# Patient Record
Sex: Female | Born: 1980 | Race: Black or African American | Hispanic: No | Marital: Single | Smoking: Never smoker
Health system: Southern US, Community
[De-identification: ages and names within clinical notes are randomized; demographics above are authoritative.]

## PROBLEM LIST (undated history)

## (undated) ENCOUNTER — Inpatient Hospital Stay (HOSPITAL_COMMUNITY): Payer: Self-pay

## (undated) DIAGNOSIS — G43909 Migraine, unspecified, not intractable, without status migrainosus: Secondary | ICD-10-CM

## (undated) DIAGNOSIS — R87629 Unspecified abnormal cytological findings in specimens from vagina: Secondary | ICD-10-CM

## (undated) DIAGNOSIS — J309 Allergic rhinitis, unspecified: Secondary | ICD-10-CM

## (undated) DIAGNOSIS — K589 Irritable bowel syndrome without diarrhea: Secondary | ICD-10-CM

## (undated) DIAGNOSIS — J45909 Unspecified asthma, uncomplicated: Secondary | ICD-10-CM

## (undated) DIAGNOSIS — E282 Polycystic ovarian syndrome: Secondary | ICD-10-CM

## (undated) DIAGNOSIS — L272 Dermatitis due to ingested food: Secondary | ICD-10-CM

## (undated) HISTORY — DX: Unspecified asthma, uncomplicated: J45.909

## (undated) HISTORY — DX: Polycystic ovarian syndrome: E28.2

## (undated) HISTORY — DX: Migraine, unspecified, not intractable, without status migrainosus: G43.909

## (undated) HISTORY — PX: GYNECOLOGIC CRYOSURGERY: SHX857

## (undated) HISTORY — DX: Irritable bowel syndrome, unspecified: K58.9

## (undated) HISTORY — DX: Dermatitis due to ingested food: L27.2

## (undated) HISTORY — DX: Unspecified abnormal cytological findings in specimens from vagina: R87.629

## (undated) HISTORY — DX: Allergic rhinitis, unspecified: J30.9

---

## 2005-09-29 ENCOUNTER — Encounter: Payer: Self-pay | Admitting: Internal Medicine

## 2006-03-20 ENCOUNTER — Emergency Department (HOSPITAL_COMMUNITY): Admission: EM | Admit: 2006-03-20 | Discharge: 2006-03-20 | Payer: Self-pay | Admitting: Emergency Medicine

## 2010-05-30 ENCOUNTER — Emergency Department (HOSPITAL_COMMUNITY): Admission: EM | Admit: 2010-05-30 | Discharge: 2010-05-30 | Payer: Self-pay | Admitting: Emergency Medicine

## 2010-11-24 ENCOUNTER — Other Ambulatory Visit: Payer: Self-pay | Admitting: Internal Medicine

## 2010-11-24 ENCOUNTER — Ambulatory Visit
Admission: RE | Admit: 2010-11-24 | Discharge: 2010-11-24 | Payer: Self-pay | Source: Home / Self Care | Attending: Internal Medicine | Admitting: Internal Medicine

## 2010-11-24 ENCOUNTER — Encounter: Payer: Self-pay | Admitting: Internal Medicine

## 2010-11-24 DIAGNOSIS — G43909 Migraine, unspecified, not intractable, without status migrainosus: Secondary | ICD-10-CM | POA: Insufficient documentation

## 2010-11-24 DIAGNOSIS — K589 Irritable bowel syndrome without diarrhea: Secondary | ICD-10-CM | POA: Insufficient documentation

## 2010-11-24 DIAGNOSIS — L272 Dermatitis due to ingested food: Secondary | ICD-10-CM | POA: Insufficient documentation

## 2010-11-24 DIAGNOSIS — J45909 Unspecified asthma, uncomplicated: Secondary | ICD-10-CM | POA: Insufficient documentation

## 2010-11-24 DIAGNOSIS — J309 Allergic rhinitis, unspecified: Secondary | ICD-10-CM | POA: Insufficient documentation

## 2010-11-24 LAB — URINALYSIS, ROUTINE W REFLEX MICROSCOPIC
Bilirubin Urine: NEGATIVE
Ketones, ur: NEGATIVE
Leukocytes, UA: NEGATIVE
Nitrite: NEGATIVE
Specific Gravity, Urine: 1.025 (ref 1.000–1.030)
Total Protein, Urine: NEGATIVE
Urine Glucose: NEGATIVE
Urobilinogen, UA: 0.2 (ref 0.0–1.0)
pH: 6 (ref 5.0–8.0)

## 2010-11-24 LAB — CBC WITH DIFFERENTIAL/PLATELET
Basophils Absolute: 0.1 10*3/uL (ref 0.0–0.1)
Basophils Relative: 1.3 % (ref 0.0–3.0)
Eosinophils Absolute: 0.2 10*3/uL (ref 0.0–0.7)
Eosinophils Relative: 1.8 % (ref 0.0–5.0)
HCT: 40 % (ref 36.0–46.0)
Hemoglobin: 13.7 g/dL (ref 12.0–15.0)
Lymphocytes Relative: 27.5 % (ref 12.0–46.0)
Lymphs Abs: 2.4 10*3/uL (ref 0.7–4.0)
MCHC: 34.2 g/dL (ref 30.0–36.0)
MCV: 92.8 fl (ref 78.0–100.0)
Monocytes Absolute: 0.4 10*3/uL (ref 0.1–1.0)
Monocytes Relative: 4.7 % (ref 3.0–12.0)
Neutro Abs: 5.6 10*3/uL (ref 1.4–7.7)
Neutrophils Relative %: 64.7 % (ref 43.0–77.0)
Platelets: 289 10*3/uL (ref 150.0–400.0)
RBC: 4.31 Mil/uL (ref 3.87–5.11)
RDW: 13 % (ref 11.5–14.6)
WBC: 8.6 10*3/uL (ref 4.5–10.5)

## 2010-11-24 LAB — LIPID PANEL
Cholesterol: 160 mg/dL (ref 0–200)
HDL: 67.7 mg/dL (ref >39.00)
LDL Cholesterol: 75 mg/dL (ref 0–99)
Total CHOL/HDL Ratio: 2
Triglycerides: 86 mg/dL (ref 0.0–149.0)
VLDL: 17.2 mg/dL (ref 0.0–40.0)

## 2010-11-24 LAB — HEPATIC FUNCTION PANEL
ALT: 13 U/L (ref 0–35)
AST: 16 U/L (ref 0–37)
Albumin: 3.7 g/dL (ref 3.5–5.2)
Alkaline Phosphatase: 58 U/L (ref 39–117)
Bilirubin, Direct: 0.1 mg/dL (ref 0.0–0.3)
Total Bilirubin: 0.6 mg/dL (ref 0.3–1.2)
Total Protein: 7.7 g/dL (ref 6.0–8.3)

## 2010-11-24 LAB — BASIC METABOLIC PANEL
BUN: 8 mg/dL (ref 6–23)
CO2: 25 mEq/L (ref 19–32)
Calcium: 9 mg/dL (ref 8.4–10.5)
Chloride: 103 mEq/L (ref 96–112)
Creatinine, Ser: 0.9 mg/dL (ref 0.4–1.2)
GFR: 92.77 mL/min (ref >60.00)
Glucose, Bld: 75 mg/dL (ref 70–99)
Potassium: 3.8 mEq/L (ref 3.5–5.1)
Sodium: 135 mEq/L (ref 135–145)

## 2010-11-24 LAB — TSH: TSH: 2.73 u[IU]/mL (ref 0.35–5.50)

## 2010-11-26 LAB — CONVERTED CEMR LAB: IgE (Immunoglobulin E), Serum: 126.9 intl units/mL (ref 0.0–180.0)

## 2010-12-01 ENCOUNTER — Encounter: Payer: Self-pay | Admitting: Internal Medicine

## 2010-12-11 NOTE — Letter (Signed)
Summary: [facility]   Imported By: Sherian Rein 12/02/2010 15:10:31  _____________________________________________________________________  External Attachment:    Type:   Image     Comment:   External Document

## 2010-12-11 NOTE — Assessment & Plan Note (Signed)
Summary: new / [facility] / # / cd   Vital Signs:  Patient profile:   30 year old female Height:      64.5 inches (163.83 cm) Weight:      138.0 pounds (62.73 kg) BMI:     23.41 O2 Sat:      97 % on Room air Temp:     98.6 degrees F (37.00 degrees C) oral Pulse rate:   80 / minute BP sitting:   100 / 68  (left arm) Cuff size:   regular  Vitals Entered By: Orlan Leavens RMA (November 24, 2010 2:05 PM)  O2 Flow:  Room air CC: New patient Is Patient Diabetic? No Pain Assessment Patient in pain? no      Comments C/o Migraines, also pt states starting to develop food allergies wan to get tested   Primary Care Provider:  Newt Lukes MD  CC:  New patient.  History of Present Illness: new pt to me and our practice, here to est care patient is here today for annual physical. Patient feels well and has no complaints.   also several concerns  c/o food allergies - esp oranges - itchy throat after OJ initially, now rash after eating orange fruit  and hives on lips after contact with orange peel - long hx seasonal and environmental allg and asthma - prev on allegra, not rx or otc med for allg now  c/o migraines onset in 5th grade -  not improved with glasses or BCP a/w nausea and light sensitivity maxalt and imitrex prev rx'd with good relief no fever, no weakness, no vision change   Preventive Screening-Counseling & Management  Alcohol-Tobacco     Alcohol drinks/day: 1     Alcohol Counseling: not indicated; use of alcohol is not excessive or problematic     Smoking Status: never     Tobacco Counseling: not indicated; no tobacco use  Caffeine-Diet-Exercise     Nutrition Referrals: no     Does Patient Exercise: yes     Exercise Counseling: not indicated; exercise is adequate  Safety-Violence-Falls     Seat Belt Counseling: not indicated; patient wears seat belts     Helmet Counseling: not indicated; patient wears helmet when riding bicycle/motocycle     Firearm  Counseling: not applicable     Violence Counseling: not indicated; no violence risk noted     Fall Risk Counseling: not indicated; no significant falls noted  Clinical Review Panels:  Prevention   Last Pap Smear:  Interpretation Result:Negative for intraepithelial Lesion or Malignancy.    (11/12/2010)   Current Medications (verified): 1)  Yasmin 28 3-0.03 Mg Tabs (Drospirenone-Ethinyl Estradiol) .... Take 1 By Mouth Once Daily 2)  Ibuprofen 200 Mg Tabs (Ibuprofen) .... Use As Needed  Allergies (verified): 1)  ! 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 I have reviewed all other systems and they were negative.   Physical Exam  General:  alert, well-developed, well-nourished, and cooperative to examination.    Head:  Normocephalic and atraumatic without obvious abnormalities. No apparent alopecia or balding. Eyes:  vision grossly intact; pupils equal, round and reactive to light.  conjunctiva and lids normal.    Ears:  normal pinnae bilaterally, without erythema, swelling, or tenderness to palpation. TMs clear, without effusion, or cerumen impaction. Hearing grossly normal bilaterally  Mouth:  teeth and gums in good repair; mucous membranes moist, without lesions or ulcers. oropharynx clear without exudate, no erythema.  Neck:  supple, full ROM, no masses, no thyromegaly; no thyroid nodules or tenderness. no JVD or carotid  bruits.   Lungs:  normal respiratory effort, no intercostal retractions or use of accessory muscles; normal breath sounds bilaterally - no crackles and no wheezes.    Heart:  normal rate, regular rhythm, no murmur, and no rub. BLE without edema.  Abdomen:  soft, non-tender, normal bowel sounds, no distention; no masses and no appreciable hepatomegaly or splenomegaly.   Genitalia:  defer to gyn Msk:  No deformity or scoliosis noted of thoracic or lumbar spine.   Neurologic:  alert & oriented X3 and cranial nerves II-XII symetrically intact.  strength normal in all extremities, sensation intact to light touch, and gait normal. speech fluent without dysarthria or aphasia; follows commands with good comprehension.  Skin:  no rashes, vesicles, ulcers, or erythema. No nodules or irregularity to palpation.  Psych:  Oriented X3, memory intact for recent and remote, normally interactive, good eye contact, not anxious appearing, not depressed appearing, and not agitated.      Impression & Recommendations:  Problem # 1:  PREVENTIVE HEALTH CARE (ICD-V70.0) Patient has been counseled on age-appropriate routine health concerns for screening and prevention. These are reviewed and up-to-date. Immunizations are up-to-date or declined. Labs ordered and will be reviewed.  Orders: TLB-BMP (Basic Metabolic Panel-BMET) (80048-METABOL) TLB-CBC Platelet - w/Differential (85025-CBCD) TLB-Hepatic/Liver Function Pnl (80076-HEPATIC) TLB-TSH (Thyroid Stimulating Hormone) (84443-TSH) TLB-Lipid Panel (80061-LIPID) TLB-Udip w/ Micro (81001-URINE)  Problem # 2:  ALLERGY, INGESTED FOOD, DERMATITIS (ICD-693.1) reorts allergy to oranges itch throat and hives on lips after contact-  food testing now and refer to allg  Orders: T-Food Allergy Profile Specific IgE (86003/[address]) Allergy Referral  (Allergy)  Problem # 3:  ALLERGIC RHINITIS (ICD-477.9) rec daily antihist use - otc ok allg refer as above Her updated  medication list for this problem includes:    Loratadine 10 Mg Tabs (Loratadine) .[name]... 1 by mouth once daily  Orders: Allergy Referral  (Allergy)  Problem # 4:  ASTHMA (ICD-493.90)  Orders: Allergy Referral  (Allergy)  Her updated medication list for this problem includes:    Proair Hfa 108 (90 Base) Mcg/act Aers (Albuterol sulfate) .[name]... 2 puffs every 4 hours as needed for breathing  Problem # 5:  MIGRAINE HEADACHE (ICD-346.90)  resume migraine tx and send for prior records Her updated medication list for this problem includes:    Ibuprofen 200 Mg Tabs (Ibuprofen) ..... Use as needed    Maxalt-mlt 10 Mg Tbdp (Rizatriptan benzoate) .[name]... 1 as needed for migraine symptoms  Problem # 6:  IRRITABLE BOWEL SYNDROME (ICD-564.1) send for prior records  Complete Medication List: 1)  Yasmin 28 3-0.03 Mg Tabs (Drospirenone-ethinyl estradiol) .... Take 1 by mouth once daily 2)  Ibuprofen 200 Mg Tabs (Ibuprofen) .... Use as needed 3)  Loratadine 10 Mg Tabs (Loratadine) .[name].. 1 by mouth once daily 4)  Maxalt-mlt 10 Mg Tbdp (Rizatriptan benzoate) .[name].. 1 as needed for migraine symptoms 5)  Proair Hfa 108 (90 Base) Mcg/act Aers (Albuterol sulfate) .Marland KitchenMarland KitchenMarland Kitchen  2 puffs every 4 hours as needed for breathing  Other Orders: Prescription Created Electronically [phone])  Patient Instructions: 1)  it was good to see you today. 2)  history and medications reviewed 3)  test(s) ordered today - your results will be  mailed to you after review in 48-72 hours from the time of test completion; if any changes need to be made or there are abnormal results, you will be contacted directly.  4)  start generic claritin (loradatine) once daily for allergy symptoms and Maxalt for migraine symptoms - your prescriptions have been electronically submitted to your pharmacy. Please take as directed. Contact our office if you believe you're having problems with the medication(s).  5)  will send for records from [facility] to  review 6)  we'll make referral to allergist. Our office will contact you regarding this appointment once made.  7)  Please schedule a follow-up appointment in 3 months to review headaches and allergies, call sooner if problems.  Prescriptions: PROAIR HFA 108 (90 BASE) MCG/ACT AERS (ALBUTEROL SULFATE) 2 puffs every 4 hours as needed for breathing  #1 x 1   Entered and Authorized by:   Newt Lukes MD   Signed by:   Newt Lukes MD on 11/24/2010   Method used:   Electronically to        [facility] [address] #[id number]* (retail)       [address] [place], [place]  [address]       Ph: [phone]       Fax: [phone]   RxID:   [id number] MAXALT-MLT 10 MG TBDP (RIZATRIPTAN BENZOATE) 1 as needed for migraine symptoms  #12 x 1   Entered and Authorized by:   Newt Lukes MD   Signed by:   Newt Lukes MD on 11/24/2010   Method used:   Electronically to        [facility] [address] #[id number]* (retail)       [address] [place], [place]  [address]       Ph: [phone]       Fax: [phone]   RxID:   [id number]    Orders Added: 1)  TLB-BMP (Basic Metabolic Panel-BMET) [80048-METABOL] 2)  TLB-CBC Platelet - w/Differential [85025-CBCD] 3)  TLB-Hepatic/Liver Function Pnl [80076-HEPATIC] 4)  TLB-TSH (Thyroid Stimulating Hormone) [84443-TSH] 5)  TLB-Lipid Panel [80061-LIPID] 6)  TLB-Udip w/ Micro [81001-URINE] 7)  T-Food Allergy Profile Specific IgE [86003/[id number]] 8)  Allergy Referral  [Allergy] 9)  New Patient 18-39 years [99385] 28)  New Patient Level III [99203] 11)  Prescription Created Electronically [id number]     Pap Smear  Procedure date:  11/12/2010  Findings:      Interpretation Result:Negative for intraepithelial Lesion or Malignancy.      Primary Care Provider:  Newt Lukes MD  CC:  New patient.  History of Present Illness: new pt to me and our practice, here to est care patient is here today for annual  physical. Patient feels well and has no complaints.   also several concerns  c/o food allergies - esp oranges - itchy throat after OJ initially, now rash after eating orange fruit  and hives on lips after contact with orange peel - long hx seasonal and environmental allg and asthma - prev on allegra, not rx or otc med for allg now  c/o migraines onset in 5th grade -  not  improved with glasses or BCP a/w nausea and light sensitivity maxalt and imitrex prev rx'd with good relief no fever, no weakness, no vision change

## 2010-12-11 NOTE — Letter (Signed)
Summary: [facility]  [facility]   Imported By: Sherian Rein 12/01/2010 09:23:11  _____________________________________________________________________  External Attachment:    Type:   Image     Comment:   External Document

## 2010-12-17 ENCOUNTER — Encounter: Payer: Self-pay | Admitting: Internal Medicine

## 2010-12-31 NOTE — Letter (Signed)
Summary: [facility]  [facility]   Imported By: Sherian Rein 12/25/2010 13:50:04  _____________________________________________________________________  External Attachment:    Type:   Image     Comment:   External Document

## 2011-01-28 ENCOUNTER — Encounter: Payer: Self-pay | Admitting: *Deleted

## 2011-02-24 ENCOUNTER — Encounter: Payer: Self-pay | Admitting: Internal Medicine

## 2011-02-24 ENCOUNTER — Ambulatory Visit (INDEPENDENT_AMBULATORY_CARE_PROVIDER_SITE_OTHER): Payer: 59 | Admitting: Internal Medicine

## 2011-02-24 VITALS — BP 98/68 | HR 71 | Temp 98.2°F | Ht 64.5 in | Wt 140.4 lb

## 2011-02-24 DIAGNOSIS — J309 Allergic rhinitis, unspecified: Secondary | ICD-10-CM

## 2011-02-24 DIAGNOSIS — G43909 Migraine, unspecified, not intractable, without status migrainosus: Secondary | ICD-10-CM

## 2011-02-24 MED ORDER — SUMATRIPTAN SUCCINATE 100 MG PO TABS: 100.0000 mg | ORAL_TABLET | Freq: Once | ORAL | Status: DC | PRN

## 2011-02-24 NOTE — Progress Notes (Signed)
  Subjective:    Patient ID: Christine Morgan, female    DOB: December 16, 1980, 30 y.o.   MRN: [id number]  HPI  Here for follow up    allergies - esp oranges - itchy throat after OJ initially, now rash after eating orange fruit  and hives on lips after contact with orange peel - long hx seasonal and environmental allg and asthma - prev on allegra, on rx  med for allg now thru allergist  migraines onset in 5th grade -  not improved with glasses or BCP a/w nausea and light sensitivity maxalt and imitrex prev rx'd with good relief no fever, no weakness, no vision change Unable to afforded maxalt due to insurance change - willing to try imitrex again  Past Medical History  Diagnosis Date  . MIGRAINE HEADACHE   . Irritable bowel syndrome   . ALLERGY, INGESTED FOOD, DERMATITIS   . ALLERGIC RHINITIS   . ASTHMA      Review of Systems  Constitutional: Negative for fatigue.  Eyes: Negative for photophobia and visual disturbance.  Respiratory: Negative for shortness of breath.   Cardiovascular: Negative for chest pain.  Neurological: Negative for seizures.       Objective:   Physical Exam  Constitutional: She is oriented to person, place, and time. She appears well-developed and well-nourished. No distress.  Eyes: Conjunctivae are normal. Pupils are equal, round, and reactive to light. No scleral icterus.  Cardiovascular: Normal rate, regular rhythm and normal heart sounds.   No murmur heard. Pulmonary/Chest: Breath sounds normal.  Neurological: She is alert and oriented to person, place, and time. No cranial nerve deficit. Coordination normal.  Psychiatric: She has a normal mood and affect. Her behavior is normal. Thought content normal.   BP 98/68  Pulse 71  Temp(Src) 98.2 F (36.8 C) (Oral)  Ht 5' 4.5" (1.638 m)  Wt 140 lb 6.4 oz (63.685 kg)  BMI 23.73 kg/m2  SpO2 98%      Lab Results  Component Value Date   WBC 8.6 11/24/2010   HGB 13.7 11/24/2010   HCT 40.0 11/24/2010   PLT 289.0 11/24/2010   CHOL 160 11/24/2010   TRIG 86.0 11/24/2010   HDL 67.70 11/24/2010   ALT 13 11/24/2010   AST 16 11/24/2010   NA 135 11/24/2010   K 3.8 11/24/2010   CL 103 11/24/2010   CREATININE 0.9 11/24/2010   BUN 8 11/24/2010   CO2 25 11/24/2010   TSH 2.73 11/24/2010   Assessment & Plan:  See problem list. Medications and labs reviewed today.

## 2011-02-24 NOTE — Patient Instructions (Signed)
It was good to see you today. Interval history and medications reviewed Stop maxalt and use generic imitrex as needed for migraine symptoms Your prescription(s) have been submitted to your pharmacy. Please take as directed and contact our office if you believe you are having problem(s) with the medication(s). Please schedule followup in 6-12 months, call sooner if problems.

## 2011-02-24 NOTE — Assessment & Plan Note (Signed)
Unable to afford maxalt - change to generic imitrex - Cont NSAIDs as well as needed

## 2011-02-27 NOTE — Assessment & Plan Note (Signed)
status post OP allergy eval - consult note reviewed The current medical regimen is effective;  continue present plan and medications.
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
## 2011-10-21 ENCOUNTER — Telehealth: Payer: Self-pay

## 2011-10-21 NOTE — Telephone Encounter (Signed)
OV please - not seen in past 6 mo - thanks

## 2011-10-21 NOTE — Telephone Encounter (Signed)
Pt called to inform MD that she is now experiencing dizziness with migraines. She has also notice that they are lasting for a longer period of time. Pt says she was told to inform MD of any changes with migraines.

## 2011-10-21 NOTE — Telephone Encounter (Signed)
Pt advised to call back and schedule OV

## 2011-10-26 ENCOUNTER — Encounter: Payer: Self-pay | Admitting: Internal Medicine

## 2011-10-26 ENCOUNTER — Ambulatory Visit (INDEPENDENT_AMBULATORY_CARE_PROVIDER_SITE_OTHER): Payer: 59 | Admitting: Internal Medicine

## 2011-10-26 VITALS — BP 102/78 | HR 81 | Temp 98.3°F | Wt 142.0 lb

## 2011-10-26 DIAGNOSIS — G43909 Migraine, unspecified, not intractable, without status migrainosus: Secondary | ICD-10-CM

## 2011-10-26 MED ORDER — BUTALBITAL-APAP-CAFFEINE 50-325-40 MG PO TABS: 1.0000 | ORAL_TABLET | Freq: Two times a day (BID) | ORAL | Status: DC | PRN

## 2011-10-26 NOTE — Assessment & Plan Note (Signed)
Chronic symptoms - 1st dx 5th grade Now progression of headache in last 2 weeks - nearly constant abdominal wall blurred vision, nausea and intense pain No weakness or other red flags Prev on maxalt - changed to generic imitrex 02/2011 - Also uses NSAIDs as needed Never had prior imaging - order MRI brain now Add esgic prn and consider alternate triptan if needed

## 2011-10-26 NOTE — Progress Notes (Signed)
  Subjective:    Patient ID: Christine Morgan, female    DOB: October 14, 1981, 30 y.o.   MRN: [id number]  HPI   Here for follow up    Migraines, chronic - but increasing symptoms in past 2 weeks Usually intermittent with 1-2 per week, now constant daily headache Initial onset in 5th grade -  not improved with corrective lenses or BCP associated with nausea and light sensitivity, new visual aura in past 2 weeks intermittent no fever, no weakness Unable to afforded maxalt  but usually controlled with imitrex - resumed 02/2011 Currently little to no relief with over-the-counter anti-inflammatories Current headache symptoms diminish following Imitrex but do not resolve    Past Medical History  Diagnosis Date  . MIGRAINE HEADACHE   . Irritable bowel syndrome   . ALLERGY, INGESTED FOOD, DERMATITIS   . ALLERGIC RHINITIS   . ASTHMA      Review of Systems  Constitutional: Negative for fatigue.  Eyes: Negative for photophobia, pain and redness.  Respiratory: Negative for shortness of breath.   Cardiovascular: Negative for chest pain.  Neurological: Negative for seizures, speech difficulty, light-headedness and numbness.       Objective:   Physical Exam   BP 102/78  Pulse 81  Temp(Src) 98.3 F (36.8 C) (Oral)  Wt 142 lb (64.411 kg)  SpO2 99% Constitutional: She appears well-developed and well-nourished. No distress.  HENT: Head: Normocephalic and atraumatic. Ears: B TMs ok, no erythema or effusion; Nose: Nose normal.  Mouth/Throat: Oropharynx is clear and moist. No oropharyngeal exudate.  Eyes: Conjunctivae and EOM are normal. Pupils are equal, round, and reactive to light. No scleral icterus.  Neck: Normal range of motion. Neck supple. No JVD present. No thyromegaly present.  Cardiovascular: Normal rate, regular rhythm and normal heart sounds.  No murmur heard. No BLE edema. Pulmonary/Chest: Effort normal and breath sounds normal. No respiratory distress. She has no wheezes.    Neurological: She is alert and oriented to person, place, and time. No cranial nerve deficit. Coordination normal.  negative Romberg. Gait and balance normal. No dysarthria or aphasia. Normal finger to nose  Psychiatric: She has a normal mood and affect. Her behavior is normal. Judgment and thought content normal.        Lab Results  Component Value Date   WBC 8.6 11/24/2010   HGB 13.7 11/24/2010   HCT 40.0 11/24/2010   PLT 289.0 11/24/2010   CHOL 160 11/24/2010   TRIG 86.0 11/24/2010   HDL 67.70 11/24/2010   ALT 13 11/24/2010   AST 16 11/24/2010   NA 135 11/24/2010   K 3.8 11/24/2010   CL 103 11/24/2010   CREATININE 0.9 11/24/2010   BUN 8 11/24/2010   CO2 25 11/24/2010   TSH 2.73 11/24/2010   Assessment & Plan:  See problem list. Medications and labs reviewed today.

## 2011-10-26 NOTE — Patient Instructions (Signed)
It was good to see you today. Will refer for MRI of the brain as discussed. We will then contact you with results once report reviewed Try Esgic twice daily as needed for headache and migraine pain symptoms - continue Imitrex as needed and other medications such as ibuprofen as ongoing

## 2011-11-09 ENCOUNTER — Ambulatory Visit
Admission: RE | Admit: 2011-11-09 | Discharge: 2011-11-09 | Disposition: A | Discharge status: Home / Self Care | Payer: 59 | Source: Ambulatory Visit | Attending: Internal Medicine | Admitting: Internal Medicine

## 2011-11-09 DIAGNOSIS — G43909 Migraine, unspecified, not intractable, without status migrainosus: Secondary | ICD-10-CM

## 2012-04-08 ENCOUNTER — Telehealth: Payer: Self-pay | Admitting: *Deleted

## 2012-04-08 DIAGNOSIS — Z0000 Encounter for general adult medical examination without abnormal findings: Secondary | ICD-10-CM

## 2012-04-08 NOTE — Telephone Encounter (Signed)
Message copied by Deatra James on Fri Apr 08, 2012 12:01 PM ------      Message from: Etheleen Sia      Created: Fri Apr 08, 2012 10:56 AM      Regarding: PHYSICAL LABS       PHYSICAL LABS FOR July 10 APPT

## 2012-04-08 NOTE — Telephone Encounter (Signed)
Received staff msg made cpx for July need labs entered... 04/08/12@12 :02pm/LMB

## 2012-05-13 ENCOUNTER — Other Ambulatory Visit (INDEPENDENT_AMBULATORY_CARE_PROVIDER_SITE_OTHER): Payer: 59

## 2012-05-13 DIAGNOSIS — Z0000 Encounter for general adult medical examination without abnormal findings: Secondary | ICD-10-CM

## 2012-05-13 LAB — CBC WITH DIFFERENTIAL/PLATELET
Eosinophils Absolute: 0.1 10*3/uL (ref 0.0–0.7)
Hemoglobin: 13.5 g/dL (ref 12.0–15.0)
Lymphs Abs: 2.3 10*3/uL (ref 0.7–4.0)
Monocytes Relative: 6.5 % (ref 3.0–12.0)
Neutro Abs: 3.8 10*3/uL (ref 1.4–7.7)
RBC: 4.31 Mil/uL (ref 3.87–5.11)
RDW: 13.4 % (ref 11.5–14.6)
WBC: 6.8 10*3/uL (ref 4.5–10.5)

## 2012-05-13 LAB — BASIC METABOLIC PANEL
BUN: 7 mg/dL (ref 6–23)
Calcium: 9 mg/dL (ref 8.4–10.5)
Creatinine, Ser: 0.9 mg/dL (ref 0.4–1.2)
Potassium: 4.1 mEq/L (ref 3.5–5.1)
Sodium: 139 mEq/L (ref 135–145)

## 2012-05-13 LAB — HEPATIC FUNCTION PANEL
ALT: 11 U/L (ref 0–35)
Total Bilirubin: 0.6 mg/dL (ref 0.3–1.2)
Total Protein: 7.4 g/dL (ref 6.0–8.3)

## 2012-05-13 LAB — LIPID PANEL
Cholesterol: 154 mg/dL (ref 0–200)
HDL: 59.8 mg/dL (ref >39.00)
Total CHOL/HDL Ratio: 3
Triglycerides: 75 mg/dL (ref 0.0–149.0)
VLDL: 15 mg/dL (ref 0.0–40.0)

## 2012-05-13 LAB — URINALYSIS, ROUTINE W REFLEX MICROSCOPIC
Ketones, ur: NEGATIVE
Leukocytes, UA: NEGATIVE
Nitrite: NEGATIVE
Specific Gravity, Urine: 1.025 (ref 1.000–1.030)
Total Protein, Urine: NEGATIVE
Urine Glucose: NEGATIVE
Urobilinogen, UA: 0.2 (ref 0.0–1.0)

## 2012-05-18 ENCOUNTER — Ambulatory Visit (INDEPENDENT_AMBULATORY_CARE_PROVIDER_SITE_OTHER): Payer: 59 | Admitting: Internal Medicine

## 2012-05-18 ENCOUNTER — Encounter: Payer: Self-pay | Admitting: Internal Medicine

## 2012-05-18 VITALS — BP 98/62 | HR 93 | Temp 98.6°F | Ht 64.5 in | Wt 150.0 lb

## 2012-05-18 DIAGNOSIS — Z0000 Encounter for general adult medical examination without abnormal findings: Secondary | ICD-10-CM

## 2012-05-18 DIAGNOSIS — K589 Irritable bowel syndrome without diarrhea: Secondary | ICD-10-CM

## 2012-05-18 NOTE — Progress Notes (Signed)
  Subjective:    Patient ID: Christine Morgan, female    DOB: Apr 05, 1981, 31 y.o.   MRN: [id number]  HPI  patient is here today for annual physical. Patient feels well overall.  Past Medical History  Diagnosis Date  . MIGRAINE HEADACHE   . Irritable bowel syndrome   . ALLERGY, INGESTED FOOD, DERMATITIS   . ALLERGIC RHINITIS   . ASTHMA    Family History  Problem Relation Age of Onset  . Hyperlipidemia Mother   . Hyperlipidemia Father   . Arthritis Maternal Grandmother   . Arthritis Paternal Grandmother    History  Substance Use Topics  . Smoking status: Never Smoker   . Smokeless tobacco: Not on file   Comment: Single-has boyfriend. Employed Architectural technologist at [employer]  . Alcohol Use: No    Review of Systems  Constitutional: Positive for fatigue. Negative for activity change.  Eyes: Negative for photophobia, pain and redness.  Respiratory: Negative for cough and shortness of breath.   Cardiovascular: Negative for chest pain.  Musculoskeletal: Negative for back pain and gait problem.  Neurological: Negative for dizziness, seizures, speech difficulty, light-headedness, numbness and headaches.       Objective:   Physical Exam  BP 98/62  Pulse 93  Temp 98.6 F (37 C) (Oral)  Ht 5' 4.5" (1.638 m)  Wt 150 lb (68.04 kg)  BMI 25.35 kg/m2  SpO2 98% Wt Readings from Last 3 Encounters:  05/18/12 150 lb (68.04 kg)  10/26/11 142 lb (64.411 kg)  02/24/11 140 lb 6.4 oz (63.685 kg)   Constitutional: She appears well-developed and well-nourished. No distress.  HENT: Head: Normocephalic and atraumatic. Ears: B TMs ok, no erythema or effusion; Nose: Nose normal. Mouth/Throat: Oropharynx is clear and moist. No oropharyngeal exudate.  Eyes: Conjunctivae and EOM are normal. Pupils are equal, round, and reactive to light. No scleral icterus.  Neck: Normal range of motion. Neck supple. No JVD present. No thyromegaly present.  Cardiovascular: Normal rate, regular rhythm  and normal heart sounds.  No murmur heard. No BLE edema. Pulmonary/Chest: Effort normal and breath sounds normal. No respiratory distress. She has no wheezes.  Abdominal: Soft. Bowel sounds are normal. She exhibits no distension. There is no tenderness. no masses Musculoskeletal: Normal range of motion, no joint effusions. No gross deformities Neurological: She is alert and oriented to person, place, and time. No cranial nerve deficit. Coordination normal.  Skin: Skin is warm and dry. No rash noted. No erythema.  Psychiatric: She has a normal mood and affect. Her behavior is normal. Judgment and thought content normal.       Lab Results  Component Value Date   WBC 6.8 05/13/2012   HGB 13.5 05/13/2012   HCT 40.5 05/13/2012   PLT 252.0 05/13/2012   CHOL 154 05/13/2012   TRIG 75.0 05/13/2012   HDL 59.80 05/13/2012   ALT 11 05/13/2012   AST 16 05/13/2012   NA 139 05/13/2012   K 4.1 05/13/2012   CL 108 05/13/2012   CREATININE 0.9 05/13/2012   BUN 7 05/13/2012   CO2 26 05/13/2012   TSH 2.52 05/13/2012   Assessment & Plan:   CPX/v70.0 - Patient has been counseled on age-appropriate routine health concerns for screening and prevention. These are reviewed and up-to-date. Immunizations are up-to-date or declined. Labs reviewed.

## 2012-05-18 NOTE — Assessment & Plan Note (Signed)
Increase in bloat symptoms over past 4 weeks Exam and recent CPX labs benign Advised probiotics, attention to diet/exercise and stress reduction/mgmt Call if worse or unimproved

## 2012-05-18 NOTE — Patient Instructions (Signed)
It was good to see you today. We have reviewed your prior records including labs and tests today Health Maintenance reviewed - all recommended immunizations and age-appropriate screenings are up-to-date. Try probiotics like Activa or pill forms like Align or [employer]Ex for your IBS and bloating Other Medications reviewed, no changes at this time. Call when refills are needed Please schedule followup in 1 year, call sooner if problems.  Preventive Care for Adults, Female A healthy lifestyle and preventive care can promote health and wellness. Preventive health guidelines for women include the following key practices.  A routine yearly physical is a good way to check with your caregiver about your health and preventive screening. It is a chance to share any concerns and updates on your health, and to receive a thorough exam.   Visit your dentist for a routine exam and preventive care every 6 months. Brush your teeth twice a day and floss once a day. Good oral hygiene prevents tooth decay and gum disease.   The frequency of eye exams is based on your age, health, family medical history, use of contact lenses, and other factors. Follow your caregiver's recommendations for frequency of eye exams.   Eat a healthy diet. Foods like vegetables, fruits, whole grains, low-fat dairy products, and lean protein foods contain the nutrients you need without too many calories. Decrease your intake of foods high in solid fats, added sugars, and salt. Eat the right amount of calories for you. Get information about a proper diet from your caregiver, if necessary.   Regular physical exercise is one of the most important things you can do for your health. Most adults should get at least 150 minutes of moderate-intensity exercise (any activity that increases your heart rate and causes you to sweat) each week. In addition, most adults need muscle-strengthening exercises on 2 or more days a week.   Maintain a  healthy weight. The body mass index (BMI) is a screening tool to identify possible weight problems. It provides an estimate of body fat based on height and weight. Your caregiver can help determine your BMI, and can help you achieve or maintain a healthy weight. For adults 20 years and older:   A BMI below 18.5 is considered underweight.   A BMI of 18.5 to 24.9 is normal.   A BMI of 25 to 29.9 is considered overweight.   A BMI of 30 and above is considered obese.   Maintain normal blood lipids and cholesterol levels by exercising and minimizing your intake of saturated fat. Eat a balanced diet with plenty of fruit and vegetables. Blood tests for lipids and cholesterol should begin at age 68 and be repeated every 5 years. If your lipid or cholesterol levels are high, you are over 50, or you are at high risk for heart disease, you may need your cholesterol levels checked more frequently. Ongoing high lipid and cholesterol levels should be treated with medicines if diet and exercise are not effective.   If you smoke, find out from your caregiver how to quit. If you do not use tobacco, do not start.   If you are pregnant, do not drink alcohol. If you are breastfeeding, be very cautious about drinking alcohol. If you are not pregnant and choose to drink alcohol, do not exceed 1 drink per day. One drink is considered to be 12 ounces (355 mL) of beer, 5 ounces (148 mL) of wine, or 1.5 ounces (44 mL) of liquor.   Avoid use of  street drugs. Do not share needles with anyone. Ask for help if you need support or instructions about stopping the use of drugs.   High blood pressure causes heart disease and increases the risk of stroke. Your blood pressure should be checked at least every 1 to 2 years. Ongoing high blood pressure should be treated with medicines if weight loss and exercise are not effective.   If you are 24 to 29 years old, ask your caregiver if you should take aspirin to prevent strokes.    Diabetes screening involves taking a blood sample to check your fasting blood sugar level. This should be done once every 3 years, after age 34, if you are within normal weight and without risk factors for diabetes. Testing should be considered at a younger age or be carried out more frequently if you are overweight and have at least 1 risk factor for diabetes.   Breast cancer screening is essential preventive care for women. You should practice "breast self-awareness." This means understanding the normal appearance and feel of your breasts and may include breast self-examination. Any changes detected, no matter how small, should be reported to a caregiver. Women in their 75s and 30s should have a clinical breast exam (CBE) by a caregiver as part of a regular health exam every 1 to 3 years. After age 74, women should have a CBE every year. Starting at age 28, women should consider having a mammography (breast X-ray test) every year. Women who have a family history of breast cancer should talk to their caregiver about genetic screening. Women at a high risk of breast cancer should talk to their caregivers about having magnetic resonance imaging (MRI) and a mammography every year.   The Pap test is a screening test for cervical cancer. A Pap test can show cell changes on the cervix that might become cervical cancer if left untreated. A Pap test is a procedure in which cells are obtained and examined from the lower end of the uterus (cervix).   Women should have a Pap test starting at age 46.   Between ages 18 and 53, Pap tests should be repeated every 2 years.   Beginning at age 52, you should have a Pap test every 3 years as long as the past 3 Pap tests have been normal.   Some women have medical problems that increase the chance of getting cervical cancer. Talk to your caregiver about these problems. It is especially important to talk to your caregiver if a new problem develops soon after your last  Pap test. In these cases, your caregiver may recommend more frequent screening and Pap tests.   The above recommendations are the same for women who have or have not gotten the vaccine for human papillomavirus (HPV).   If you had a hysterectomy for a problem that was not cancer or a condition that could lead to cancer, then you no longer need Pap tests. Even if you no longer need a Pap test, a regular exam is a good idea to make sure no other problems are starting.   If you are between ages 23 and 58, and you have had normal Pap tests going back 10 years, you no longer need Pap tests. Even if you no longer need a Pap test, a regular exam is a good idea to make sure no other problems are starting.   If you have had past treatment for cervical cancer or a condition that could lead to cancer, you  need Pap tests and screening for cancer for at least 20 years after your treatment.   If Pap tests have been discontinued, risk factors (such as a new sexual partner) need to be reassessed to determine if screening should be resumed.   The HPV test is an additional test that may be used for cervical cancer screening. The HPV test looks for the virus that can cause the cell changes on the cervix. The cells collected during the Pap test can be tested for HPV. The HPV test could be used to screen women aged 67 years and older, and should be used in women of any age who have unclear Pap test results. After the age of 52, women should have HPV testing at the same frequency as a Pap test.   Colorectal cancer can be detected and often prevented. Most routine colorectal cancer screening begins at the age of 88 and continues through age 14. However, your caregiver may recommend screening at an earlier age if you have risk factors for colon cancer. On a yearly basis, your caregiver may provide home test kits to check for hidden blood in the stool. Use of a small camera at the end of a tube, to directly examine the colon  (sigmoidoscopy or colonoscopy), can detect the earliest forms of colorectal cancer. Talk to your caregiver about this at age 30, when routine screening begins.  Direct examination of the colon should be repeated every 5 to 10 years through age 80, unless early forms of pre-cancerous polyps or small growths are found.   Hepatitis C blood testing is recommended for all people born from [birth year] through 1965 and any individual with known risks for hepatitis C.   Practice safe sex. Use condoms and avoid high-risk sexual practices to reduce the spread of sexually transmitted infections (STIs). STIs include gonorrhea, chlamydia, syphilis, trichomonas, herpes, HPV, and human immunodeficiency virus (HIV). Herpes, HIV, and HPV are viral illnesses that have no cure. They can result in disability, cancer, and death. Sexually active women aged 100 and younger should be checked for chlamydia. Older women with new or multiple partners should also be tested for chlamydia. Testing for other STIs is recommended if you are sexually active and at increased risk.   Osteoporosis is a disease in which the bones lose minerals and strength with aging. This can result in serious bone fractures. The risk of osteoporosis can be identified using a bone density scan. Women ages 71 and over and women at risk for fractures or osteoporosis should discuss screening with their caregivers. Ask your caregiver whether you should take a calcium supplement or vitamin D to reduce the rate of osteoporosis.   Menopause can be associated with physical symptoms and risks. Hormone replacement therapy is available to decrease symptoms and risks. You should talk to your caregiver about whether hormone replacement therapy is right for you.   Use sunscreen with sun protection factor (SPF) of 30 or more. Apply sunscreen liberally and repeatedly throughout the day. You should seek shade when your shadow is shorter than you. Protect yourself by wearing long  sleeves, pants, a wide-brimmed hat, and sunglasses year round, whenever you are outdoors.   Once a month, do a whole body skin exam, using a mirror to look at the skin on your back. Notify your caregiver of new moles, moles that have irregular borders, moles that are larger than a pencil eraser, or moles that have changed in shape or color.   Stay current with required  immunizations.   Influenza. You need a dose every fall (or winter). The composition of the flu vaccine changes each year, so being vaccinated once is not enough.   Pneumococcal polysaccharide. You need 1 to 2 doses if you smoke cigarettes or if you have certain chronic medical conditions. You need 1 dose at age 58 (or older) if you have never been vaccinated.   Tetanus, diphtheria, pertussis (Tdap, Td). Get 1 dose of Tdap vaccine if you are younger than age 66, are over 34 and have contact with an infant, are a Research scientist (physical sciences), are pregnant, or simply want to be protected from whooping cough. After that, you need a Td booster dose every 10 years. Consult your caregiver if you have not had at least 3 tetanus and diphtheria-containing shots sometime in your life or have a deep or dirty wound.   HPV. You need this vaccine if you are a woman age 87 or younger. The vaccine is given in 3 doses over 6 months.   Measles, mumps, rubella (MMR). You need at least 1 dose of MMR if you were born in 1957 or later. You may also need a second dose.   Meningococcal. If you are age 103 to 40 and a first-year college student living in a residence hall, or have one of several medical conditions, you need to get vaccinated against meningococcal disease. You may also need additional booster doses.   Zoster (shingles). If you are age 30 or older, you should get this vaccine.   Varicella (chickenpox). If you have never had chickenpox or you were vaccinated but received only 1 dose, talk to your caregiver to find out if you need this vaccine.    Hepatitis A. You need this vaccine if you have a specific risk factor for hepatitis A virus infection or you simply wish to be protected from this disease. The vaccine is usually given as 2 doses, 6 to 18 months apart.   Hepatitis B. You need this vaccine if you have a specific risk factor for hepatitis B virus infection or you simply wish to be protected from this disease. The vaccine is given in 3 doses, usually over 6 months.  Preventive Services / Frequency Ages 69 to 17  Blood pressure check.** / Every 1 to 2 years.   Lipid and cholesterol check.** / Every 5 years beginning at age 66.   Clinical breast exam.** / Every 3 years for women in their 52s and 30s.   Pap test.** / Every 2 years from ages 11 through 54. Every 3 years starting at age 69 through age 40 or 62 with a history of 3 consecutive normal Pap tests.   HPV screening.** / Every 3 years from ages 47 through ages 46 to 31 with a history of 3 consecutive normal Pap tests.   Hepatitis C blood test.** / For any individual with known risks for hepatitis C.   Skin self-exam. / Monthly.   Influenza immunization.** / Every year.   Pneumococcal polysaccharide immunization.** / 1 to 2 doses if you smoke cigarettes or if you have certain chronic medical conditions.   Tetanus, diphtheria, pertussis (Tdap, Td) immunization. / A one-time dose of Tdap vaccine. After that, you need a Td booster dose every 10 years.   HPV immunization. / 3 doses over 6 months, if you are 76 and younger.   Measles, mumps, rubella (MMR) immunization. / You need at least 1 dose of MMR if you were born in 1957 or later.  You may also need a second dose.   Meningococcal immunization. / 1 dose if you are age 81 to 39 and a first-year college student living in a residence hall, or have one of several medical conditions, you need to get vaccinated against meningococcal disease. You may also need additional booster doses.   Varicella immunization.** /  Consult your caregiver.   Hepatitis A immunization.** / Consult your caregiver. 2 doses, 6 to 18 months apart.   Hepatitis B immunization.** / Consult your caregiver. 3 doses usually over 6 months.  Ages 19 to 17  Blood pressure check.** / Every 1 to 2 years.   Lipid and cholesterol check.** / Every 5 years beginning at age 25.   Clinical breast exam.** / Every year after age 34.   Mammogram.** / Every year beginning at age 53 and continuing for as long as you are in good health. Consult with your caregiver.   Pap test.** / Every 3 years starting at age 72 through age 34 or 78 with a history of 3 consecutive normal Pap tests.   HPV screening.** / Every 3 years from ages 44 through ages 49 to 110 with a history of 3 consecutive normal Pap tests.   Fecal occult blood test (FOBT) of stool. / Every year beginning at age 47 and continuing until age 37. You may not need to do this test if you get a colonoscopy every 10 years.   Flexible sigmoidoscopy or colonoscopy.** / Every 5 years for a flexible sigmoidoscopy or every 10 years for a colonoscopy beginning at age 30 and continuing until age 86.   Hepatitis C blood test.** / For all people born from [birth year] through 1965 and any individual with known risks for hepatitis C.   Skin self-exam. / Monthly.   Influenza immunization.** / Every year.   Pneumococcal polysaccharide immunization.** / 1 to 2 doses if you smoke cigarettes or if you have certain chronic medical conditions.   Tetanus, diphtheria, pertussis (Tdap, Td) immunization.** / A one-time dose of Tdap vaccine. After that, you need a Td booster dose every 10 years.   Measles, mumps, rubella (MMR) immunization. / You need at least 1 dose of MMR if you were born in 1957 or later. You may also need a second dose.   Varicella immunization.** / Consult your caregiver.   Meningococcal immunization.** / Consult your caregiver.   Hepatitis A immunization.** / Consult your caregiver. 2  doses, 6 to 18 months apart.   Hepatitis B immunization.** / Consult your caregiver. 3 doses, usually over 6 months.  Ages 62 and over  Blood pressure check.** / Every 1 to 2 years.   Lipid and cholesterol check.** / Every 5 years beginning at age 76.   Clinical breast exam.** / Every year after age 55.   Mammogram.** / Every year beginning at age 71 and continuing for as long as you are in good health. Consult with your caregiver.   Pap test.** / Every 3 years starting at age 51 through age 58 or 15 with a 3 consecutive normal Pap tests. Testing can be stopped between 65 and 70 with 3 consecutive normal Pap tests and no abnormal Pap or HPV tests in the past 10 years.   HPV screening.** / Every 3 years from ages 46 through ages 49 or 83 with a history of 3 consecutive normal Pap tests. Testing can be stopped between 65 and 70 with 3 consecutive normal Pap tests and no abnormal Pap or HPV  tests in the past 10 years.   Fecal occult blood test (FOBT) of stool. / Every year beginning at age 76 and continuing until age 41. You may not need to do this test if you get a colonoscopy every 10 years.   Flexible sigmoidoscopy or colonoscopy.** / Every 5 years for a flexible sigmoidoscopy or every 10 years for a colonoscopy beginning at age 40 and continuing until age 60.   Hepatitis C blood test.** / For all people born from [birth year] through 1965 and any individual with known risks for hepatitis C.   Osteoporosis screening.** / A one-time screening for women ages 66 and over and women at risk for fractures or osteoporosis.   Skin self-exam. / Monthly.   Influenza immunization.** / Every year.   Pneumococcal polysaccharide immunization.** / 1 dose at age 58 (or older) if you have never been vaccinated.   Tetanus, diphtheria, pertussis (Tdap, Td) immunization. / A one-time dose of Tdap vaccine if you are over 65 and have contact with an infant, are a Research scientist (physical sciences), or simply want to be protected  from whooping cough. After that, you need a Td booster dose every 10 years.   Varicella immunization.** / Consult your caregiver.   Meningococcal immunization.** / Consult your caregiver.   Hepatitis A immunization.** / Consult your caregiver. 2 doses, 6 to 18 months apart.   Hepatitis B immunization.** / Check with your caregiver. 3 doses, usually over 6 months.  ** Family history and personal history of risk and conditions may change your caregiver's recommendations. Document Released: 12/22/2001 Document Revised: 10/15/2011 Document Reviewed: 03/23/2011 [facility] Patient Information 2012 [place], [place].

## 2012-07-24 ENCOUNTER — Other Ambulatory Visit: Payer: Self-pay | Admitting: Internal Medicine

## 2012-07-25 ENCOUNTER — Other Ambulatory Visit: Payer: Self-pay | Admitting: General Practice

## 2012-07-25 MED ORDER — SUMATRIPTAN SUCCINATE 100 MG PO TABS: 100.0000 mg | ORAL_TABLET | Freq: Once | ORAL | Status: DC | PRN

## 2012-09-01 ENCOUNTER — Telehealth: Payer: Self-pay | Admitting: Internal Medicine

## 2012-09-01 NOTE — Telephone Encounter (Signed)
Caller: Agness/Patient; Patient Name: Christine Morgan; PCP: Rene Paci (Adults only); Best Callback Phone Number: [phone].  Pt. c/o frontal headache, for 2 weeks, intermittently.  Pt. also complains of dry nasal passages.  Pt. has not checked her temperature.  Pt. has history of migraines, but feels this is not her typical migraine.  Pt. does get some relief with Ibuprofen, but the headache returns.  Triaged with Headache Protocol and emergent sx. r/o per guidelines with exception to: Recurring headache relieved with nonprescription medication and has recurred for more than 5 consecutive days.  Disposition:  See Provider within 2 weeks.  Appointment scheduled with Dr. Felicity Coyer 09/02/12 at 10:00.  Home care instructions given. CAN/db.

## 2012-09-01 NOTE — Telephone Encounter (Signed)
Noted, thanks!

## 2012-09-02 ENCOUNTER — Ambulatory Visit (INDEPENDENT_AMBULATORY_CARE_PROVIDER_SITE_OTHER): Payer: 59 | Admitting: Internal Medicine

## 2012-09-02 ENCOUNTER — Encounter: Payer: Self-pay | Admitting: Internal Medicine

## 2012-09-02 VITALS — BP 100/68 | HR 67 | Temp 98.4°F | Ht 64.5 in | Wt 148.8 lb

## 2012-09-02 DIAGNOSIS — J019 Acute sinusitis, unspecified: Secondary | ICD-10-CM

## 2012-09-02 DIAGNOSIS — G43909 Migraine, unspecified, not intractable, without status migrainosus: Secondary | ICD-10-CM

## 2012-09-02 DIAGNOSIS — J309 Allergic rhinitis, unspecified: Secondary | ICD-10-CM

## 2012-09-02 MED ORDER — AZITHROMYCIN 250 MG PO TABS: ORAL_TABLET | ORAL | Status: DC

## 2012-09-02 NOTE — Progress Notes (Signed)
  HPI Review of Systems  Physical Exam A user error has taken place -see second note for today's evaluation.

## 2012-09-02 NOTE — Assessment & Plan Note (Signed)
Chronic symptoms - 1st dx 5th grade New symptoms in past month exacerbated by allergic sinusitis, see above No red flags on history or exam Prev on maxalt - changed to generic imitrex 02/2011 - Also uses NSAIDs as needed MRI brain December 2012 unremarkable Add esgic prn and consider alternate triptan if needed

## 2012-09-02 NOTE — Assessment & Plan Note (Signed)
status post OP allergy eval - consult note reviewed The current medical regimen is effective;  continue present plan and medications.  

## 2012-09-02 NOTE — Progress Notes (Signed)
  Subjective:    Patient ID: Christine Morgan, female    DOB: [birth year], 31 y.o.   MRN: [id number]  HPI  Review of Systems   Physical Exam  Subjective:     Christine Morgan is a 31 y.o. female who presents for evaluation of sinus pain. Symptoms include: cough, facial pain, headaches, nasal congestion, periorbital venous congestion and sinus pressure. Onset of symptoms was 2 week ago. Symptoms have been unchanged since that time. Past history is significant for asthma. Patient is a non-smoker. Patient does have history of migraines, feels like this is not her typical migraine and the imitrex is not working for her. Ibuprofen does seem to relieve the headache and sinus pain for a little while but the pain always returns.  The following portions of the patient's history were reviewed and updated as appropriate: problem list.  Review of Systems A comprehensive review of systems was negative except for: Constitutional: positive for malaise Eyes: positive for irritation Ears, nose, mouth, throat, and face: positive for nasal congestion and voice change Respiratory: positive for cough and post nasal drip   Objective:    General appearance: alert, cooperative and appears stated age Head: Normocephalic, without obvious abnormality, atraumatic, sinuses tender to percussion Eyes: conjunctivae/corneas clear. PERRL, EOM's intact. Fundi benign. Ears: normal TM's and external ear canals both ears Nose: Nares normal. Septum midline. Mucosa normal. No drainage or sinus tenderness., mild congestion, sinus tenderness bilateral Throat: lips, mucosa, and tongue normal; teeth and gums normal Neck: no adenopathy Heart: S1, S2 normal    Assessment:    Acute allergic sinusitis.    Plan:    Unable to take flonase due to drying effect noticed in the past Can try saline nasal spray Continue OTC allegra Will give a z-pack to see if it improves symptoms RTC if symptoms do not improve    I was present and  personally evaluated tis patient in addition to documented hx/exam as here by NP Noor Witte. I agree with a/p as here

## 2012-09-02 NOTE — Patient Instructions (Addendum)
Sinusitis  Sinusitis an infection of the air pockets (sinuses) in your face. This can cause puffiness (swelling). It can also cause drainage from your sinuses.    HOME CARE     Only take medicine as told by your doctor.   Drink enough fluids to keep your pee (urine) clear or pale yellow.   Apply moist heat or ice packs for pain relief.   Use salt (saline) nose sprays. The spray will wet the thick fluid in the nose. This can help the sinuses drain.  GET HELP RIGHT AWAY IF:     You have a fever.   Your baby is older than 3 months with a rectal temperature of 102 F (38.9 C) or higher.   Your baby is 3 months old or younger with a rectal temperature of 100.4 F (38 C) or higher.   The pain gets worse.   You get a very bad headache.   You keep throwing up (vomiting).   Your face gets puffy.  MAKE SURE YOU:     Understand these instructions.   Will watch your condition.   Will get help right away if you are not doing well or get worse.  Document Released: 04/13/2008 Document Revised: 01/18/2012 Document Reviewed: 04/13/2008  [employer] Patient Information 2013 [employer].

## 2012-12-27 ENCOUNTER — Encounter: Payer: Self-pay | Admitting: *Deleted

## 2012-12-27 ENCOUNTER — Ambulatory Visit (INDEPENDENT_AMBULATORY_CARE_PROVIDER_SITE_OTHER): Payer: 59 | Admitting: Internal Medicine

## 2012-12-27 ENCOUNTER — Encounter: Payer: Self-pay | Admitting: Internal Medicine

## 2012-12-27 VITALS — BP 92/62 | HR 90 | Temp 98.2°F | Wt 142.0 lb

## 2012-12-27 DIAGNOSIS — J45901 Unspecified asthma with (acute) exacerbation: Secondary | ICD-10-CM

## 2012-12-27 MED ORDER — PREDNISONE (PAK) 10 MG PO TABS: 10.0000 mg | ORAL_TABLET | ORAL | Status: DC

## 2012-12-27 MED ORDER — PROMETHAZINE-CODEINE 6.25-10 MG/5ML PO SYRP: 5.0000 mL | ORAL_SOLUTION | ORAL | Status: DC | PRN

## 2012-12-27 NOTE — Progress Notes (Signed)
  Subjective:    Patient ID: Christine Morgan, female    DOB: June 03, 1981, 32 y.o.   MRN: [id number]  Cough This is a new problem. The current episode started 1 to 4 weeks ago. The problem has been waxing and waning. The problem occurs every few minutes. The cough is non-productive. Associated symptoms include a fever (LGF) and shortness of breath. Pertinent negatives include no chest pain, chills, ear congestion, headaches, nasal congestion, postnasal drip, sore throat or weight loss. Wheezing: ? -feels tight. The symptoms are aggravated by lying down and cold air. She has tried a beta-agonist inhaler for the symptoms. The treatment provided mild relief. Her past medical history is significant for asthma and environmental allergies. There is no history of bronchitis, COPD, emphysema or pneumonia.   Past Medical History  Diagnosis Date  . MIGRAINE HEADACHE   . Irritable bowel syndrome   . ALLERGY, INGESTED FOOD, DERMATITIS   . ALLERGIC RHINITIS   . ASTHMA     Review of Systems  Constitutional: Positive for fever (LGF). Negative for chills and weight loss.  HENT: Negative for sore throat and postnasal drip.   Respiratory: Positive for cough and shortness of breath. Wheezing: ? -feels tight.   Cardiovascular: Negative for chest pain.  Allergic/Immunologic: Positive for environmental allergies.  Neurological: Negative for headaches.       Objective:   Physical Exam BP 92/62  Pulse 90  Temp(Src) 98.2 F (36.8 C) (Oral)  Wt 142 lb (64.411 kg)  BMI 24.01 kg/m2  SpO2 97% Constitutional: She appears well-developed and well-nourished. No distress.  HENT: Head: Normocephalic and atraumatic. Sinus nontender Ears: B TMs ok, no erythema or effusion; Nose: Nose normal.  Mouth/Throat: Oropharynx mild erythema, but clear and moist. No oropharyngeal exudate.  Eyes: Conjunctivae and EOM are normal. Pupils are equal, round, and reactive to light. No scleral icterus.  Neck: Normal range of motion. Neck  supple. No JVD present. No thyromegaly present.  Cardiovascular: Normal rate, regular rhythm and normal heart sounds.  No murmur heard. No BLE edema. Pulmonary/Chest: Effort normal and breath sounds normal. No respiratory distress, but cough with expeiration. She has no wheezes.  Psychiatric: She has a normal mood and affect. Her behavior is normal. Judgment and thought content normal.   Lab Results  Component Value Date   WBC 6.8 05/13/2012   HGB 13.5 05/13/2012   HCT 40.5 05/13/2012   PLT 252.0 05/13/2012   GLUCOSE 97 05/13/2012   CHOL 154 05/13/2012   TRIG 75.0 05/13/2012   HDL 59.80 05/13/2012   LDLCALC 79 05/13/2012   ALT 11 05/13/2012   AST 16 05/13/2012   NA 139 05/13/2012   K 4.1 05/13/2012   CL 108 05/13/2012   CREATININE 0.9 05/13/2012   BUN 7 05/13/2012   CO2 26 05/13/2012   TSH 2.52 05/13/2012       Assessment & Plan:   Acute asthma exacerbation, likely viral Explained lack of efficacy for traditional antibiotics and viral disease Prednisone taper now for acute bronchospasm and inflammation causing cough Codeine cough syrup each bedtime as needed Continue albuterol rescue inhaler as needed, consider adding maintenance steroid inhaler if greater than one flare per quarter ( last exacerbation over one year ago) Work excuse note for today provided

## 2012-12-27 NOTE — Patient Instructions (Signed)
It was good to see you today. If you develop worsening symptoms or fever, call and we can reconsider antibiotics, but it does not appear necessary to use antibiotics at this time. Prednisone taper over next 6 days - take as directed for asthma flae prescription cough syrup for bedtime if needed-  Your prescription(s) have been submitted to your pharmacy (or given to you). Please take as directed and contact our office if you believe you are having problem(s) with the medication(s). Alternate between ibuprofen and tylenol for aches, pain and fever symptoms as discussed Hydrate, rest and call if worse or unimproved Work excuse note for today as discussed

## 2013-04-06 ENCOUNTER — Other Ambulatory Visit: Payer: Self-pay | Admitting: Internal Medicine

## 2013-04-10 ENCOUNTER — Telehealth: Payer: Self-pay | Admitting: Internal Medicine

## 2013-04-10 DIAGNOSIS — G43909 Migraine, unspecified, not intractable, without status migrainosus: Secondary | ICD-10-CM

## 2013-04-10 MED ORDER — BUTALBITAL-APAP-CAFFEINE 50-325-40 MG PO TABS: 1.0000 | ORAL_TABLET | Freq: Two times a day (BID) | ORAL | Status: AC | PRN

## 2013-04-10 NOTE — Telephone Encounter (Signed)
Ok - done - thanks

## 2013-04-10 NOTE — Telephone Encounter (Signed)
Called pt no answer LMOM rx sent to [facility]../lmb 

## 2013-04-10 NOTE — Telephone Encounter (Signed)
The patient called and is hoping to get her migraine medicine called in until her scheduled cpe (July 23rd).  She states it is Butalbital (not sure about the spelling.Marland KitchenMarland Kitchen?)  Her callback number is [phone]

## 2013-05-09 ENCOUNTER — Encounter: Payer: Self-pay | Admitting: Internal Medicine

## 2013-05-09 ENCOUNTER — Ambulatory Visit (INDEPENDENT_AMBULATORY_CARE_PROVIDER_SITE_OTHER): Payer: 59 | Admitting: Internal Medicine

## 2013-05-09 ENCOUNTER — Other Ambulatory Visit (INDEPENDENT_AMBULATORY_CARE_PROVIDER_SITE_OTHER): Payer: 59

## 2013-05-09 VITALS — BP 122/80 | HR 94 | Temp 99.2°F | Ht 64.5 in | Wt 137.5 lb

## 2013-05-09 DIAGNOSIS — R300 Dysuria: Secondary | ICD-10-CM

## 2013-05-09 LAB — URINALYSIS, ROUTINE W REFLEX MICROSCOPIC
Bilirubin Urine: NEGATIVE
Ketones, ur: NEGATIVE
Nitrite: NEGATIVE
Specific Gravity, Urine: 1.005 (ref 1.000–1.030)
Urine Glucose: NEGATIVE

## 2013-05-09 MED ORDER — PHENAZOPYRIDINE HCL 100 MG PO TABS: 100.0000 mg | ORAL_TABLET | Freq: Three times a day (TID) | ORAL | Status: DC | PRN

## 2013-05-09 MED ORDER — CEPHALEXIN 500 MG PO CAPS: 500.0000 mg | ORAL_CAPSULE | Freq: Four times a day (QID) | ORAL | Status: DC

## 2013-05-09 NOTE — Progress Notes (Signed)
  Subjective:    Patient ID: Christine Morgan, female    DOB: [birth year], 31 y.o.   MRN: [id number]  HPI    Here with c/o very unusual for her 2 days onset dysuria, with freq and urgency, but Denies urinary symptoms such as flank pain, or n/v, fever, chills.  No hx of stone or prior UTI.  Incidentally with 3 loose stools since last night but Denies worsening reflux, abd pain, dysphagia, n/v, other bowel change or blood.  Noted some BRB with urination last night, none since then. Pt denies chest pain, increased sob or doe, wheezing, orthopnea, PND, increased LE swelling, palpitations, dizziness or syncope. Past Medical History  Diagnosis Date  . MIGRAINE HEADACHE   . Irritable bowel syndrome   . ALLERGY, INGESTED FOOD, DERMATITIS   . ALLERGIC RHINITIS   . ASTHMA    Past Surgical History  Procedure Laterality Date  . Gynecologic cryosurgery      reports that she has never smoked. She does not have any smokeless tobacco history on file. She reports that she does not drink alcohol or use illicit drugs. family history includes Arthritis in her maternal grandmother and paternal grandmother and Hyperlipidemia in her father and mother. Allergies  Allergen Reactions  . Penicillins    Current Outpatient Prescriptions on File Prior to Visit  Medication Sig Dispense Refill  . fexofenadine (ALLEGRA ALLERGY) 180 MG tablet Take 180 mg by mouth daily.        [name] ibuprofen (ADVIL,MOTRIN) 200 MG tablet Take 200 mg by mouth every 6 (six) hours as needed.        [name] MICROGESTIN FE 1/20 1-20 MG-MCG tablet Take 1 by mouth daily      . SUMAtriptan (IMITREX) 100 MG tablet Take 1 tablet (100 mg total) by mouth once as needed.  10 tablet  2  . VENTOLIN HFA 108 (90 BASE) MCG/ACT inhaler INHALE 2 PUFFS BY MOUTH EVERY 4 HOURS AS NEEDED FOR BREATHING  18 g  1   No current facility-administered medications on file prior to visit.   Review of Systems All otherwise neg per pt     Objective:   Physical Exam BP 122/80   Pulse 94  Temp(Src) 99.2 F (37.3 C) (Oral)  Ht 5' 4.5" (1.638 m)  Wt 137 lb 8 oz (62.37 kg)  BMI 23.25 kg/m2  SpO2 98% VS noted,  Constitutional: Pt appears well-developed and well-nourished.  HENT: Head: NCAT.  Right Ear: External ear normal.  Left Ear: External ear normal.  Eyes: Conjunctivae and EOM are normal. Pupils are equal, round, and reactive to light.  Neck: Normal range of motion. Neck supple.  Cardiovascular: Normal rate and regular rhythm.   Pulmonary/Chest: Effort normal and breath sounds normal.  Abd:  Soft,  non-distended, + BS but tender lower mid abd/suprapubic area., no guarding or rebound Neurological: Pt is alert. Not confused  Skin: Skin is warm. No erythema. No LE edema Psychiatric: Pt behavior is normal. Thought content normal. 1+ nervous    Assessment & Plan:

## 2013-05-09 NOTE — Patient Instructions (Signed)
Please take all new medication as prescribed - the antibiotic, and urinary pain medicine Please continue all other medications as before, and refills have been done if requested. Your specimen will be sent to the lab  You will be contacted by phone if any changes need to be made immediately.  Otherwise, you will receive a letter about your results with an explanation, but please check with MyChart first.  Please remember to sign up for My Chart if you have not done so, as this will be important to you in the future with finding out test results, communicating by private email, and scheduling acute appointments online when needed.

## 2013-05-10 NOTE — Assessment & Plan Note (Signed)
Mild to mod, prob cystitis, for urinary studies and antibx course,  to f/u any worsening symptoms or concerns

## 2013-05-11 LAB — URINE CULTURE

## 2013-05-23 ENCOUNTER — Telehealth: Payer: Self-pay | Admitting: Internal Medicine

## 2013-05-23 DIAGNOSIS — N390 Urinary tract infection, site not specified: Secondary | ICD-10-CM

## 2013-05-23 NOTE — Telephone Encounter (Signed)
Notified pt with md response.../lmb 

## 2013-05-23 NOTE — Telephone Encounter (Signed)
Will order repeat UA and Ucx - lab only for sample If abnormal, will re-rx abx -  If no persisting proof of UTI, will need OV for eval of symptoms  thanks

## 2013-05-23 NOTE — Telephone Encounter (Signed)
Pt called stated that she saw Dr. Jonny Ruiz for uti and finish med as directed. Pt stated that the uti symptom is now back and  she is also experiencing lower back pain. Please advise. Offer an appt but pt would like to see what the doctor can do first.

## 2013-05-24 ENCOUNTER — Other Ambulatory Visit (INDEPENDENT_AMBULATORY_CARE_PROVIDER_SITE_OTHER): Payer: 59

## 2013-05-24 DIAGNOSIS — N390 Urinary tract infection, site not specified: Secondary | ICD-10-CM

## 2013-05-24 LAB — URINALYSIS, ROUTINE W REFLEX MICROSCOPIC
Leukocytes, UA: NEGATIVE
Nitrite: NEGATIVE
Specific Gravity, Urine: >=1.03 (ref 1.000–1.030)
pH: 6 (ref 5.0–8.0)

## 2013-05-26 ENCOUNTER — Other Ambulatory Visit: Payer: Self-pay | Admitting: Internal Medicine

## 2013-05-26 MED ORDER — NITROFURANTOIN MONOHYD MACRO 100 MG PO CAPS: 100.0000 mg | ORAL_CAPSULE | Freq: Two times a day (BID) | ORAL | Status: DC

## 2013-05-31 ENCOUNTER — Ambulatory Visit (INDEPENDENT_AMBULATORY_CARE_PROVIDER_SITE_OTHER): Payer: 59 | Admitting: Internal Medicine

## 2013-05-31 ENCOUNTER — Encounter: Payer: Self-pay | Admitting: Internal Medicine

## 2013-05-31 VITALS — BP 90/64 | HR 73 | Temp 98.0°F | Wt 137.8 lb

## 2013-05-31 DIAGNOSIS — Z0000 Encounter for general adult medical examination without abnormal findings: Secondary | ICD-10-CM

## 2013-05-31 NOTE — Patient Instructions (Signed)
It was good to see you today. We have reviewed your prior records including labs and tests today Health Maintenance reviewed - all recommended immunizations and age-appropriate screenings are up-to-date. Test(s) ordered today. Return when you are fasting. Your results will be released to MyChart (or called to you) after review, usually within 72hours after test completion. If any changes need to be made, you will be notified at that same time. Medications reviewed, no changes at this time. Call when refills are needed Please schedule followup in 1 year, call sooner if problems.  Health Maintenance, Females A healthy lifestyle and preventative care can promote health and wellness.  Maintain regular health, dental, and eye exams.  Eat a healthy diet. Foods like vegetables, fruits, whole grains, low-fat dairy products, and lean protein foods contain the nutrients you need without too many calories. Decrease your intake of foods high in solid fats, added sugars, and salt. Get information about a proper diet from your caregiver, if necessary.  Regular physical exercise is one of the most important things you can do for your health. Most adults should get at least 150 minutes of moderate-intensity exercise (any activity that increases your heart rate and causes you to sweat) each week. In addition, most adults need muscle-strengthening exercises on 2 or more days a week.   Maintain a healthy weight. The body mass index (BMI) is a screening tool to identify possible weight problems. It provides an estimate of body fat based on height and weight. Your caregiver can help determine your BMI, and can help you achieve or maintain a healthy weight. For adults 20 years and older:  A BMI below 18.5 is considered underweight.  A BMI of 18.5 to 24.9 is normal.  A BMI of 25 to 29.9 is considered overweight.  A BMI of 30 and above is considered obese.  Maintain normal blood lipids and cholesterol by  exercising and minimizing your intake of saturated fat. Eat a balanced diet with plenty of fruits and vegetables. Blood tests for lipids and cholesterol should begin at age 36 and be repeated every 5 years. If your lipid or cholesterol levels are high, you are over 50, or you are a high risk for heart disease, you may need your cholesterol levels checked more frequently.Ongoing high lipid and cholesterol levels should be treated with medicines if diet and exercise are not effective.  If you smoke, find out from your caregiver how to quit. If you do not use tobacco, do not start.  If you are pregnant, do not drink alcohol. If you are breastfeeding, be very cautious about drinking alcohol. If you are not pregnant and choose to drink alcohol, do not exceed 1 drink per day. One drink is considered to be 12 ounces (355 mL) of beer, 5 ounces (148 mL) of wine, or 1.5 ounces (44 mL) of liquor.  Avoid use of street drugs. Do not share needles with anyone. Ask for help if you need support or instructions about stopping the use of drugs.  High blood pressure causes heart disease and increases the risk of stroke. Blood pressure should be checked at least every 1 to 2 years. Ongoing high blood pressure should be treated with medicines, if weight loss and exercise are not effective.  If you are 54 to 30 years old, ask your caregiver if you should take aspirin to prevent strokes.  Diabetes screening involves taking a blood sample to check your fasting blood sugar level. This should be done once every 3  years, after age 38, if you are within normal weight and without risk factors for diabetes. Testing should be considered at a younger age or be carried out more frequently if you are overweight and have at least 1 risk factor for diabetes.  Breast cancer screening is essential preventative care for women. You should practice "breast self-awareness." This means understanding the normal appearance and feel of your  breasts and may include breast self-examination. Any changes detected, no matter how small, should be reported to a caregiver. Women in their 34s and 30s should have a clinical breast exam (CBE) by a caregiver as part of a regular health exam every 1 to 3 years. After age 82, women should have a CBE every year. Starting at age 52, women should consider having a mammogram (breast X-ray) every year. Women who have a family history of breast cancer should talk to their caregiver about genetic screening. Women at a high risk of breast cancer should talk to their caregiver about having an MRI and a mammogram every year.  The Pap test is a screening test for cervical cancer. Women should have a Pap test starting at age 12. Between ages 53 and 29, Pap tests should be repeated every 2 years. Beginning at age 24, you should have a Pap test every 3 years as long as the past 3 Pap tests have been normal. If you had a hysterectomy for a problem that was not cancer or a condition that could lead to cancer, then you no longer need Pap tests. If you are between ages 61 and 67, and you have had normal Pap tests going back 10 years, you no longer need Pap tests. If you have had past treatment for cervical cancer or a condition that could lead to cancer, you need Pap tests and screening for cancer for at least 20 years after your treatment. If Pap tests have been discontinued, risk factors (such as a new sexual partner) need to be reassessed to determine if screening should be resumed. Some women have medical problems that increase the chance of getting cervical cancer. In these cases, your caregiver may recommend more frequent screening and Pap tests.  The human papillomavirus (HPV) test is an additional test that may be used for cervical cancer screening. The HPV test looks for the virus that can cause the cell changes on the cervix. The cells collected during the Pap test can be tested for HPV. The HPV test could be used to  screen women aged 15 years and older, and should be used in women of any age who have unclear Pap test results. After the age of 58, women should have HPV testing at the same frequency as a Pap test.  Colorectal cancer can be detected and often prevented. Most routine colorectal cancer screening begins at the age of 9 and continues through age 74. However, your caregiver may recommend screening at an earlier age if you have risk factors for colon cancer. On a yearly basis, your caregiver may provide home test kits to check for hidden blood in the stool. Use of a small camera at the end of a tube, to directly examine the colon (sigmoidoscopy or colonoscopy), can detect the earliest forms of colorectal cancer. Talk to your caregiver about this at age 69, when routine screening begins. Direct examination of the colon should be repeated every 5 to 10 years through age 66, unless early forms of pre-cancerous polyps or small growths are found.  Hepatitis C blood  testing is recommended for all people born from [birth year] through 1963 and any individual with known risks for hepatitis C.  Practice safe sex. Use condoms and avoid high-risk sexual practices to reduce the spread of sexually transmitted infections (STIs). Sexually active women aged 38 and younger should be checked for Chlamydia, which is a common sexually transmitted infection. Older women with new or multiple partners should also be tested for Chlamydia. Testing for other STIs is recommended if you are sexually active and at increased risk.  Osteoporosis is a disease in which the bones lose minerals and strength with aging. This can result in serious bone fractures. The risk of osteoporosis can be identified using a bone density scan. Women ages 34 and over and women at risk for fractures or osteoporosis should discuss screening with their caregivers. Ask your caregiver whether you should be taking a calcium supplement or vitamin D to reduce the rate of  osteoporosis.  Menopause can be associated with physical symptoms and risks. Hormone replacement therapy is available to decrease symptoms and risks. You should talk to your caregiver about whether hormone replacement therapy is right for you.  Use sunscreen with a sun protection factor (SPF) of 30 or greater. Apply sunscreen liberally and repeatedly throughout the day. You should seek shade when your shadow is shorter than you. Protect yourself by wearing long sleeves, pants, a wide-brimmed hat, and sunglasses year round, whenever you are outdoors.  Notify your caregiver of new moles or changes in moles, especially if there is a change in shape or color. Also notify your caregiver if a mole is larger than the size of a pencil eraser.  Stay current with your immunizations. Document Released: 05/11/2011 Document Revised: 01/18/2012 Document Reviewed: 05/11/2011 [facility] Patient Information 2014 [place], [place].

## 2013-05-31 NOTE — Progress Notes (Signed)
  Subjective:    Patient ID: Christine Morgan, female    DOB: Mar 26, 1981, 32 y.o.   MRN: [id number]  HPI  patient is here today for annual physical. Patient feels well overall. Reviewed recent UTI issues  Past Medical History  Diagnosis Date  . MIGRAINE HEADACHE   . Irritable bowel syndrome   . ALLERGY, INGESTED FOOD, DERMATITIS   . ALLERGIC RHINITIS   . ASTHMA    Family History  Problem Relation Age of Onset  . Hyperlipidemia Mother   . Hyperlipidemia Father   . Arthritis Maternal Grandmother   . Arthritis Paternal Grandmother    History  Substance Use Topics  . Smoking status: Never Smoker   . Smokeless tobacco: Not on file     Comment: Single-has boyfriend. Employed Architectural technologist at [employer]  . Alcohol Use: No    Review of Systems  Constitutional: Positive for fatigue. Negative for activity change.  Eyes: Negative for photophobia, pain and redness.  Respiratory: Negative for cough and shortness of breath.   Cardiovascular: Negative for chest pain.  Musculoskeletal: Negative for back pain and gait problem.  Neurological: Negative for dizziness, seizures, speech difficulty, light-headedness, numbness and headaches.  No other specific complaints in a complete review of systems (except as listed in HPI above).      Objective:   Physical Exam  BP 90/64  Pulse 73  Temp(Src) 98 F (36.7 C) (Oral)  Wt 137 lb 12.8 oz (62.506 kg)  BMI 23.3 kg/m2  SpO2 98% Wt Readings from Last 3 Encounters:  05/31/13 137 lb 12.8 oz (62.506 kg)  05/09/13 137 lb 8 oz (62.37 kg)  12/27/12 142 lb (64.411 kg)   Constitutional: She appears well-developed and well-nourished. No distress.  HENT: Head: Normocephalic and atraumatic. Ears: B TMs ok, no erythema or effusion; Nose: Nose normal. Mouth/Throat: Oropharynx is clear and moist. No oropharyngeal exudate.  Eyes: Conjunctivae and EOM are normal. Pupils are equal, round, and reactive to light. No scleral icterus.  Neck:  Normal range of motion. Neck supple. No JVD present. No thyromegaly present.  Cardiovascular: Normal rate, regular rhythm and normal heart sounds.  No murmur heard. No BLE edema. Pulmonary/Chest: Effort normal and breath sounds normal. No respiratory distress. She has no wheezes.  Abdominal: Soft. Bowel sounds are normal. She exhibits no distension. There is no tenderness. no masses Musculoskeletal: Normal range of motion, no joint effusions. No gross deformities Neurological: She is alert and oriented to person, place, and time. No cranial nerve deficit. Coordination normal.  Skin: Skin is warm and dry. No rash noted. No erythema.  Psychiatric: She has a normal mood and affect. Her behavior is normal. Judgment and thought content normal.       Lab Results  Component Value Date   WBC 6.8 05/13/2012   HGB 13.5 05/13/2012   HCT 40.5 05/13/2012   PLT 252.0 05/13/2012   CHOL 154 05/13/2012   TRIG 75.0 05/13/2012   HDL 59.80 05/13/2012   ALT 11 05/13/2012   AST 16 05/13/2012   NA 139 05/13/2012   K 4.1 05/13/2012   CL 108 05/13/2012   CREATININE 0.9 05/13/2012   BUN 7 05/13/2012   CO2 26 05/13/2012   TSH 2.52 05/13/2012   Assessment & Plan:   CPX/v70.0 - Patient has been counseled on age-appropriate routine health concerns for screening and prevention. These are reviewed and up-to-date. Immunizations are up-to-date or declined. Labs ordered and reviewed.

## 2013-06-02 ENCOUNTER — Other Ambulatory Visit (INDEPENDENT_AMBULATORY_CARE_PROVIDER_SITE_OTHER): Payer: 59

## 2013-06-02 DIAGNOSIS — Z0000 Encounter for general adult medical examination without abnormal findings: Secondary | ICD-10-CM

## 2013-06-02 LAB — BASIC METABOLIC PANEL
GFR: 82.85 mL/min (ref >60.00)
Potassium: 4.4 mEq/L (ref 3.5–5.1)
Sodium: 137 mEq/L (ref 135–145)

## 2013-06-02 LAB — CBC WITH DIFFERENTIAL/PLATELET
Eosinophils Relative: 2.2 % (ref 0.0–5.0)
HCT: 38.9 % (ref 36.0–46.0)
Hemoglobin: 13 g/dL (ref 12.0–15.0)
Lymphs Abs: 1.4 10*3/uL (ref 0.7–4.0)
Monocytes Relative: 5.3 % (ref 3.0–12.0)
Neutro Abs: 3 10*3/uL (ref 1.4–7.7)
RBC: 4.1 Mil/uL (ref 3.87–5.11)
WBC: 4.8 10*3/uL (ref 4.5–10.5)

## 2013-06-02 LAB — URINALYSIS, ROUTINE W REFLEX MICROSCOPIC
Bilirubin Urine: NEGATIVE
Ketones, ur: NEGATIVE
Nitrite: NEGATIVE
Total Protein, Urine: NEGATIVE
pH: 6 (ref 5.0–8.0)

## 2013-06-02 LAB — LIPID PANEL
Cholesterol: 133 mg/dL (ref 0–200)
HDL: 50.7 mg/dL (ref >39.00)
VLDL: 11 mg/dL (ref 0.0–40.0)

## 2013-06-02 LAB — HEPATIC FUNCTION PANEL
ALT: 8 U/L (ref 0–35)
Total Bilirubin: 0.7 mg/dL (ref 0.3–1.2)
Total Protein: 7.2 g/dL (ref 6.0–8.3)

## 2013-07-27 ENCOUNTER — Ambulatory Visit: Payer: Self-pay | Admitting: Obstetrics & Gynecology

## 2013-08-03 ENCOUNTER — Encounter: Payer: Self-pay | Admitting: Obstetrics & Gynecology

## 2013-08-03 ENCOUNTER — Ambulatory Visit (INDEPENDENT_AMBULATORY_CARE_PROVIDER_SITE_OTHER): Payer: 59 | Admitting: Obstetrics & Gynecology

## 2013-08-03 VITALS — BP 108/71 | HR 90 | Temp 98.4°F | Ht 64.0 in | Wt 138.0 lb

## 2013-08-03 DIAGNOSIS — Z01419 Encounter for gynecological examination (general) (routine) without abnormal findings: Secondary | ICD-10-CM

## 2013-08-03 DIAGNOSIS — Z0000 Encounter for general adult medical examination without abnormal findings: Secondary | ICD-10-CM

## 2013-08-03 NOTE — Progress Notes (Signed)
.   Subjective:     Christine Morgan is a 32 y.o. female here for a routine exam.  No current complaints .  Personal health questionnaire reviewed: no.   Gynecologic History Patient's last menstrual period was 07/25/2013. Contraception: OCP (estrogen/progesterone) Last Pap: 2013. Results were: normal Last mammogram: N/A  Obstetric History OB History  No data available     The following portions of the patient's history were reviewed and updated as appropriate: allergies, current medications, past family history, past medical history, past social history, past surgical history and problem list.  Review of Systems Pertinent items are noted in HPI.    Objective:      General appearance: alert Breasts: normal appearance, no masses or tenderness Abdomen: soft, non-tender; bowel sounds normal; no masses,  no organomegaly Pelvic: cervix normal in appearance, external genitalia normal, no adnexal masses or tenderness, uterus normal size, shape, and consistency and vagina normal without discharge       Assessment:    Healthy female exam.    Plan:   Return prn

## 2013-08-04 ENCOUNTER — Encounter: Payer: Self-pay | Admitting: Obstetrics & Gynecology

## 2013-08-04 NOTE — Patient Instructions (Signed)

## 2013-08-05 LAB — PAP IG, CT-NG, RFX HPV ASCU

## 2013-08-20 ENCOUNTER — Other Ambulatory Visit: Payer: Self-pay | Admitting: Obstetrics & Gynecology

## 2013-09-06 ENCOUNTER — Encounter: Payer: Self-pay | Admitting: Internal Medicine

## 2013-09-06 ENCOUNTER — Ambulatory Visit (INDEPENDENT_AMBULATORY_CARE_PROVIDER_SITE_OTHER): Payer: 59 | Admitting: Internal Medicine

## 2013-09-06 VITALS — BP 96/60 | HR 98 | Temp 98.6°F | Ht 64.5 in | Wt 136.4 lb

## 2013-09-06 DIAGNOSIS — J019 Acute sinusitis, unspecified: Secondary | ICD-10-CM

## 2013-09-06 DIAGNOSIS — H6980 Other specified disorders of Eustachian tube, unspecified ear: Secondary | ICD-10-CM | POA: Insufficient documentation

## 2013-09-06 DIAGNOSIS — J309 Allergic rhinitis, unspecified: Secondary | ICD-10-CM

## 2013-09-06 DIAGNOSIS — G43909 Migraine, unspecified, not intractable, without status migrainosus: Secondary | ICD-10-CM

## 2013-09-06 DIAGNOSIS — H6982 Other specified disorders of Eustachian tube, left ear: Secondary | ICD-10-CM

## 2013-09-06 DIAGNOSIS — H6990 Unspecified Eustachian tube disorder, unspecified ear: Secondary | ICD-10-CM | POA: Insufficient documentation

## 2013-09-06 MED ORDER — ONDANSETRON HCL 4 MG PO TABS: 4.0000 mg | ORAL_TABLET | Freq: Three times a day (TID) | ORAL | Status: DC | PRN

## 2013-09-06 MED ORDER — MECLIZINE HCL 12.5 MG PO TABS: 12.5000 mg | ORAL_TABLET | Freq: Three times a day (TID) | ORAL | Status: DC | PRN

## 2013-09-06 MED ORDER — LEVOFLOXACIN 250 MG PO TABS: 250.0000 mg | ORAL_TABLET | Freq: Every day | ORAL | Status: DC

## 2013-09-06 NOTE — Progress Notes (Signed)
Subjective:    Patient ID: Christine Morgan, female    DOB: Apr 09, 1981, 32 y.o.   MRN: [id number]  HPI   Here with 2-3 days acute onset fever, facial pain, pressure, headache, general weakness and malaise, and greenish d/c, with mild ST and cough, but pt denies chest pain, wheezing, increased sob or doe, orthopnea, PND, increased LE swelling, palpitations, dizziness or syncope. Does have several wks ongoing nasal allergy symptoms with clearish congestion, itch and sneezing, without fever, pain, ST, cough, swelling or wheezing.  Has seen allergy and rec'd for allergy shots, but not yet started.  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 Has allegraD at home but not working for current symptoms.  Has occas migraine set off by allergy symptoms it seems but none recent.  Pt denies chest pain, increased sob or doe, wheezing, orthopnea, PND, increased LE swelling, palpitations, dizziness or syncope. Past Medical History  Diagnosis Date  . MIGRAINE HEADACHE   . Irritable bowel syndrome   . ALLERGY, INGESTED FOOD, DERMATITIS   . ALLERGIC RHINITIS   . ASTHMA   . PCOS (polycystic ovarian syndrome)    Past Surgical History  Procedure Laterality Date  . Gynecologic cryosurgery      reports that she has never smoked. She does not have any smokeless tobacco history on file. She reports that she drinks alcohol. She reports that she does not use illicit drugs. family history includes Arthritis in her maternal grandmother, mother, and paternal grandmother; Cancer in her maternal grandmother; Heart disease in her paternal grandmother; Hyperlipidemia in her father; Liver disease in her paternal grandfather. Allergies  Allergen Reactions  . Penicillins    Current Outpatient Prescriptions on File Prior to Visit  Medication Sig Dispense Refill  . fexofenadine (ALLEGRA ALLERGY) 180 MG tablet Take 180 mg by mouth daily.        [name] ibuprofen (ADVIL,MOTRIN)  200 MG tablet Take 200 mg by mouth every 6 (six) hours as needed.        [name] MICROGESTIN FE 1/20 1-20 MG-MCG tablet TAKE 1 TABLET BY MOUTH EVERY DAY  28 tablet  11  . SUMAtriptan (IMITREX) 100 MG tablet Take 1 tablet (100 mg total) by mouth once as needed.  10 tablet  2  . VENTOLIN HFA 108 (90 BASE) MCG/ACT inhaler INHALE 2 PUFFS BY MOUTH EVERY 4 HOURS AS NEEDED FOR BREATHING  18 g  1   No current facility-administered medications on file prior to visit.   Review of Systems  Constitutional: Negative for unexpected weight change, or unusual diaphoresis  HENT: Negative for tinnitus.   Eyes: Negative for photophobia and visual disturbance.  Respiratory: Negative for choking and stridor.   Gastrointestinal: Negative for vomiting and blood in stool.  Genitourinary: Negative for hematuria and decreased urine volume.  Musculoskeletal: Negative for acute joint swelling Skin: Negative for color change and wound.  Neurological: Negative for tremors and numbness other than noted  Psychiatric/Behavioral: Negative for decreased concentration or  hyperactivity.       Objective:   Physical Exam BP 96/60  Pulse 98  Temp(Src) 98.6 F (37 C) (Oral)  Ht 5' 4.5" (1.638 m)  Wt 136 lb 6 oz (61.859 kg)  BMI 23.06 kg/m2  SpO2 93% VS noted,  Constitutional: Pt appears well-developed and well-nourished.  HENT: Head: NCAT.  Right Ear: External ear normal. Right TM mild eythema only Left Ear: External ear normal.  Left TM severe red with post fluid  Bilat tm's with mild erythema.  Max sinus areas mild tender.  Pharynx with mild erythema, no exudate Eyes: Conjunctivae and EOM are normal. Pupils are equal, round, and reactive to light.  Neck: Normal range of motion. Neck supple.  Cardiovascular: Normal rate and regular rhythm.   Pulmonary/Chest: Effort normal and breath sounds normal.  Abd:  Soft, NT, non-distended, + BS Neurological: Pt is alert. Not confused  Skin: Skin is warm. No erythema.   Psychiatric: Pt behavior is normal. Thought content normal.     Assessment & Plan:

## 2013-09-06 NOTE — Assessment & Plan Note (Signed)
Mild to mod, for antibx course,  to f/u any worsening symptoms or concerns 

## 2013-09-06 NOTE — Assessment & Plan Note (Signed)
With dizziness - for mucinex otc prn, meclizine prn

## 2013-09-06 NOTE — Assessment & Plan Note (Signed)
To cont the allegra d, consider f/u with allergy but she is hesitant to pursue the shots at this time

## 2013-09-06 NOTE — Patient Instructions (Signed)
Please take all new medication as prescribed - the antibiotic (levaquin), meclizine for dizziness, and zofran for nausea Please continue all other medications as before, including the allegra D Please have the pharmacy call with any other refills you may need.  Please keep your appointments with your specialists as you have planned  - allergy

## 2013-09-06 NOTE — Assessment & Plan Note (Signed)
stable overall by history and exam, and pt to continue medical treatment as before,  to f/u any worsening symptoms or concerns 

## 2013-09-14 ENCOUNTER — Other Ambulatory Visit: Payer: Self-pay

## 2014-01-11 ENCOUNTER — Other Ambulatory Visit: Payer: Self-pay | Admitting: *Deleted

## 2014-01-11 MED ORDER — FERRALET 90 90-1 MG PO TABS: 1.0000 | ORAL_TABLET | Freq: Every day | ORAL | Status: DC

## 2014-01-16 ENCOUNTER — Encounter: Payer: Self-pay | Admitting: Obstetrics & Gynecology

## 2014-06-12 ENCOUNTER — Other Ambulatory Visit: Payer: Self-pay | Admitting: *Deleted

## 2014-06-12 MED ORDER — SUMATRIPTAN SUCCINATE 100 MG PO TABS: 100.0000 mg | ORAL_TABLET | Freq: Once | ORAL | Status: DC | PRN

## 2014-06-12 NOTE — Telephone Encounter (Signed)
Left msg made cpx appt not until 08/28/14. Wanting to get refill on imitrex. Called pt back inform her will send enough until she see md.../lmb

## 2014-07-19 ENCOUNTER — Other Ambulatory Visit: Payer: Self-pay | Admitting: Obstetrics & Gynecology

## 2014-07-20 NOTE — Telephone Encounter (Signed)
Please advise 

## 2014-07-22 NOTE — Telephone Encounter (Signed)
OK to refill

## 2014-07-23 ENCOUNTER — Other Ambulatory Visit: Payer: Self-pay | Admitting: Obstetrics & Gynecology

## 2014-07-23 NOTE — Telephone Encounter (Signed)
Request approved for pharmacy.  

## 2014-08-18 ENCOUNTER — Other Ambulatory Visit: Payer: Self-pay | Admitting: Obstetrics & Gynecology

## 2014-08-22 ENCOUNTER — Telehealth: Payer: Self-pay | Admitting: *Deleted

## 2014-08-22 DIAGNOSIS — Z3041 Encounter for surveillance of contraceptive pills: Secondary | ICD-10-CM

## 2014-08-22 MED ORDER — NORETHIN ACE-ETH ESTRAD-FE 1-20 MG-MCG PO TABS: ORAL_TABLET | ORAL | Status: DC

## 2014-08-22 NOTE — Telephone Encounter (Signed)
Fax from pharmacy requesting RF of Microgestin. Patient last annual 07/2013. Rx [facility] x3 [facility]

## 2014-08-22 NOTE — Telephone Encounter (Signed)
10.14.2015 -  Left patient VM to please call and schedule AEX. brm

## 2014-08-28 ENCOUNTER — Encounter: Payer: 59 | Admitting: Internal Medicine

## 2014-08-28 ENCOUNTER — Ambulatory Visit (INDEPENDENT_AMBULATORY_CARE_PROVIDER_SITE_OTHER): Payer: 59 | Admitting: Family

## 2014-08-28 ENCOUNTER — Encounter: Payer: Self-pay | Admitting: Family

## 2014-08-28 ENCOUNTER — Other Ambulatory Visit (INDEPENDENT_AMBULATORY_CARE_PROVIDER_SITE_OTHER): Payer: 59

## 2014-08-28 VITALS — BP 100/78 | HR 93 | Temp 97.9°F | Resp 18 | Ht 64.5 in | Wt 142.4 lb

## 2014-08-28 DIAGNOSIS — T148 Other injury of unspecified body region: Secondary | ICD-10-CM

## 2014-08-28 DIAGNOSIS — Z0000 Encounter for general adult medical examination without abnormal findings: Secondary | ICD-10-CM

## 2014-08-28 DIAGNOSIS — M25562 Pain in left knee: Secondary | ICD-10-CM

## 2014-08-28 DIAGNOSIS — W57XXXA Bitten or stung by nonvenomous insect and other nonvenomous arthropods, initial encounter: Secondary | ICD-10-CM

## 2014-08-28 DIAGNOSIS — M25561 Pain in right knee: Secondary | ICD-10-CM | POA: Insufficient documentation

## 2014-08-28 LAB — LIPID PANEL
Cholesterol: 139 mg/dL (ref 0–200)
HDL: 61.3 mg/dL
LDL Cholesterol: 71 mg/dL (ref 0–99)
NonHDL: 77.7
Total CHOL/HDL Ratio: 2
Triglycerides: 34 mg/dL (ref 0.0–149.0)
VLDL: 6.8 mg/dL (ref 0.0–40.0)

## 2014-08-28 LAB — BASIC METABOLIC PANEL
BUN: 8 mg/dL (ref 6–23)
CO2: 19 mEq/L (ref 19–32)
CREATININE: 0.7 mg/dL (ref 0.4–1.2)
Calcium: 9.7 mg/dL (ref 8.4–10.5)
Chloride: 105 mEq/L (ref 96–112)
GFR: 127.54 mL/min (ref >60.00)
Glucose, Bld: 88 mg/dL (ref 70–99)
Potassium: 3.6 mEq/L (ref 3.5–5.1)
SODIUM: 140 meq/L (ref 135–145)

## 2014-08-28 LAB — CBC
HCT: 42.9 % (ref 36.0–46.0)
Hemoglobin: 14.2 g/dL (ref 12.0–15.0)
MCHC: 33.2 g/dL (ref 30.0–36.0)
MCV: 90.5 fl (ref 78.0–100.0)
Platelets: 271 10*3/uL (ref 150.0–400.0)
RBC: 4.74 Mil/uL (ref 3.87–5.11)
RDW: 12.9 % (ref 11.5–15.5)
WBC: 8.1 10*3/uL (ref 4.0–10.5)

## 2014-08-28 MED ORDER — TRIAMCINOLONE ACETONIDE 0.1 % EX CREA: 1.0000 "application " | TOPICAL_CREAM | Freq: Two times a day (BID) | CUTANEOUS | Status: DC

## 2014-08-28 NOTE — Progress Notes (Signed)
Subjective:    Patient ID: Christine MajorsAshley Morgan, female    DOB: 10-21-1981, 33 y.o.   MRN: [id number]  Chief Complaint  Patient presents with  . CPE    Has knee pain that started 1 year ago, also has a mosquito bite that x4 months that will not heal    HPI:  Christine Morgan is a 33 y.o. female who presents today for an annual wellness visit.  1) Health Maintenance -   Diet - Eats a variety of fruits, vegetables and protein Wt Readings from Last 3 Encounters:  08/28/14 142 lb 6.4 oz (64.592 kg)  09/06/13 136 lb 6 oz (61.859 kg)  08/03/13 138 lb (62.596 kg)    Exercise - Tries to work out 3-4x week; weights and sometimes cardio.  2) Preventative Exams / Immunizations:  Dental -- Up to date Vision -- Up to date Immunizations -- Declines flu shot; Will have tetanus shot today. PAP -- Up to Date  3) Knee pain - for about a year. Used to run, when she tried to start running again her knee began to hurt. Progressed walking to jogging to running and started out sharp and achy and rates the pain around a 2/10. Denies any trauma. Attempted knee braces which did not help. Currently her knees do not hurt.   4) Mosquito bite - Went to [place] in June had multiple mosquito bites. One still remains on her lateral right ankle which she has itched and developed a scab. Has been using cortisone for the itching which initially help, but on occasion will continue to itch. Got a fever that night but would did not have any since. Currently itching waxes and wanes.   Allergies  Allergen Reactions  . Penicillins    Current Outpatient Prescriptions on File Prior to Visit  Medication Sig Dispense Refill  . Fe Cbn-Fe Gluc-FA-B12-C-DSS (FERRALET 90) 90-1 MG TABS Take 1 tablet by mouth daily.  30 each  11  . fexofenadine (ALLEGRA ALLERGY) 180 MG tablet Take 180 mg by mouth daily.        [name]. ibuprofen (ADVIL,MOTRIN) 200 MG tablet Take 200 mg by mouth every 6 (six) hours as needed.        . SUMAtriptan  (IMITREX) 100 MG tablet Take 1 tablet (100 mg total) by mouth once as needed.  10 tablet  2  . VENTOLIN HFA 108 (90 BASE) MCG/ACT inhaler INHALE 2 PUFFS BY MOUTH EVERY 4 HOURS AS NEEDED FOR BREATHING  18 g  1  . levofloxacin (LEVAQUIN) 250 MG tablet Take 1 tablet (250 mg total) by mouth daily.  10 tablet  0  . meclizine (ANTIVERT) 12.5 MG tablet Take 1 tablet (12.5 mg total) by mouth 3 (three) times daily as needed.  30 tablet  1  . norethindrone-ethinyl estradiol (MICROGESTIN FE 1/20) 1-20 MG-MCG tablet TAKE 1 TABLET BY MOUTH EVERY DAY  84 tablet  0  . ondansetron (ZOFRAN) 4 MG tablet Take 1 tablet (4 mg total) by mouth every 8 (eight) hours as needed for nausea.  30 tablet  0   No current facility-administered medications on file prior to visit.   Family History  Problem Relation Age of Onset  . Arthritis Mother   . Hyperlipidemia Father   . Arthritis Maternal Grandmother   . Cancer Maternal Grandmother   . Arthritis Paternal Grandmother   . Heart disease Paternal Grandmother   . Liver disease Paternal Grandfather    History   Social History  . Marital  Status: Single    Spouse Name: N/A    Number of Children: N/A  . Years of Education: N/A   Social History Main Topics  . Smoking status: Never Smoker   . Smokeless tobacco: None     Comment: Single-has boyfriend. Employed Architectural technologistmarketing officer at [employer]  . Alcohol Use: Yes     Comment: Occ.  . Drug Use: No  . Sexual Activity: Yes   Other Topics Concern  . None   Social History Narrative  . None    Review of Systems   Constitutional: Denies fever, chills, fatigue, or significant weight gain/loss. HENT:  Head: Denies headache or neck pain  Migraines - may be triggered by dairy; reasonably controlled with Imitrex and ibuprofen.  Ears: Denies changes in hearing, ringing in ears, earache, drainage  Nose: Denies discharge, stuffiness, itching, nosebleed, sinus pain  Throat: Denies sore throat, hoarseness, dry  mouth, sores, thrush Eyes: Denies loss/changes in vision, pain, redness, blurry/double vision, flashing lights Cardiovascular: Denies chest pain/discomfort, tightness, palpitations, shortness of breath with activity, difficulty lying down, swelling, sudden awakening with shortness of breath Respiratory: Denies shortness of breath, cough, sputum production, wheezing Gastrointestinal: Denies dysphasia, heartburn, change in appetite, nausea, change in bowel habits, rectal bleeding, constipation, diarrhea, yellow skin or eyes Occasional constipation - told she had IBS but currently well managed.  Genitourinary: Denies frequency, urgency, burning/pain, blood in urine, incontinence, change in urinary strength. Musculoskeletal: Denies stiffness, back pain, redness or swelling of joints, trauma Knee pain as described above Skin: Denies rashes, lumps, dryness, color changes, or hair/nail changes Mosquito bite as above Neurological: Denies dizziness, fainting, seizures, weakness, numbness, tingling, tremor Psychiatric - Denies nervousness, stress, depression or memory loss Endocrine: Denies heat or cold intolerance, sweating, frequent urination, excessive thirst, changes in appetite Hematologic: Denies ease of bruising or bleeding    Objective:    BP 100/78  Pulse 93  Temp(Src) 97.9 F (36.6 C) (Oral)  Resp 18  Ht 5' 4.5" (1.638 m)  Wt 142 lb 6.4 oz (64.592 kg)  BMI 24.07 kg/m2  SpO2 95% Nursing note and vital signs reviewed.  Physical Exam  Constitutional: She is oriented to person, place, and time. She appears well-developed and well-nourished. No distress.  HENT:  Head: Normocephalic.  Right Ear: Hearing, tympanic membrane, external ear and ear canal normal.  Left Ear: Hearing, tympanic membrane, external ear and ear canal normal.  Nose: Nose normal.  Mouth/Throat: Uvula is midline, oropharynx is clear and moist and mucous membranes are normal.  Eyes: Conjunctivae and EOM are normal.  Pupils are equal, round, and reactive to light.  Neck: Normal range of motion. Neck supple. No JVD present. No tracheal deviation present. No thyromegaly present.  Cardiovascular: Normal rate, regular rhythm and normal heart sounds.   Pulmonary/Chest: Effort normal and breath sounds normal.  Abdominal: Soft. Bowel sounds are normal. She exhibits no distension and no mass. There is no tenderness. There is no rebound and no guarding.  Musculoskeletal:  No obvious deformity, discoloration or edema present in bilateral knees. ROM and strength are full bilaterally. Mild tenderness anterior joint line bilaterally.   Lymphadenopathy:    She has no cervical adenopathy.  Neurological: She is alert and oriented to person, place, and time. She has normal reflexes. No cranial nerve deficit. Coordination normal.  Skin: Skin is warm and dry.  Penny sized lesion present on lateral aspect of right ankle just superior to right malleolus. No signs of infection present.   Psychiatric: She  has a normal mood and affect. Her behavior is normal. Judgment and thought content normal.       Assessment & Plan:

## 2014-08-28 NOTE — Assessment & Plan Note (Addendum)
1) Anticipatory Guidance:  Discussed importance of wearing seatbelts.  Importance of wearing sun screen when outdoors.   2) Risk Factor Reduction: Discussed eating healthy diet / exercise to reduce risk of heart disease and  3) Immunizations / Screenings / Labs:  Immunizaitons are currently up to date. Will need a Td booster next year. CBC, BMET and Lipid panel ordered.

## 2014-08-28 NOTE — Progress Notes (Signed)
Pre visit review using our clinic review tool, if applicable. No additional management support is needed unless otherwise documented below in the visit note. 

## 2014-08-28 NOTE — Assessment & Plan Note (Signed)
No obvious knee pathology, may be patellar in origin. Counseled on appropriate footwear and avoid excessive running on concrete. Use ibuprofen as needed for pain and inflammation. Recommend ice 20 minutes following activity and stretching her quadriceps and hamstring.

## 2014-08-28 NOTE — Patient Instructions (Signed)
Thank you for choosing [facility].  Summary/Instructions:   Please stop by the lab prior to leaving  Your prescription has been sent to your pharmacy for the steroid cream.  For your knees - consider a good pair of shoes (Off and Running is a good spot). After running consider icing 20 minutes. Use ibuprofen as needed for pain and inflammation.

## 2014-08-28 NOTE — Assessment & Plan Note (Signed)
No obvious infection. Symptoms of itching continue to persist. Start triamcinaolone cream as needed to alleviate itching and inflammation.

## 2014-09-28 ENCOUNTER — Telehealth: Payer: Self-pay | Admitting: Internal Medicine

## 2014-09-28 NOTE — Telephone Encounter (Signed)
MD not in the office, nor we have in availability pt can be seen tomorrow sat clinic.Forwarding msg to SUPERVALU INCscheduler Shana.[name].Raechel Chute/lmb

## 2014-09-28 NOTE — Telephone Encounter (Signed)
Patient Information:  Caller Name: Morrie Sheldonshley  Phone: [phone]  Patient: Christine Morgan, Christine Morgan  Gender: Female  DOB: 08/01/1981  Age: [age] Years  PCP: Rene PaciLeschber, Valerie (Adults only)  Pregnant: No  Office Follow Up:  Does the office need to follow up with this patient?: Yes  Instructions For The Office: Ear pain, no appointments available, patient declines to go to other practice  RN Note:  Patient calling regarding ear pain and sore throat.  Currently taking OTC med's and states she feels better as day goes on, but in the morning symptoms have returned.  Symptoms  Reason For Call & Symptoms: sore throat, ear pain  Reviewed Health History In EMR: Yes  Reviewed Medications In EMR: Yes  Reviewed Allergies In EMR: Yes  Reviewed Surgeries / Procedures: Yes  Date of Onset of Symptoms: 09/24/2014  Treatments Tried: alkaseltzer cold and sinus  Treatments Tried Worked: No OB / GYN:  LMP: 09/14/2014  Guideline(s) Used:  Earache  Disposition Per Guideline:   See Today in Office  Reason For Disposition Reached:   All other earaches (Exceptions: earache lasting < 1 hour, and earache from air travel)  Advice Given:  Apply Cold to the Area for Pain:  Apply a cold pack or a cold wet washcloth to the outer ear for 20 minutes to reduce pain while the pain medicine takes effect (Note: Some individuals prefer local heat instead of cold for 20 minutes).  Call Back If  Earache last more than 1 hour  High fever, severe headache, or stiff neck occurs  You become worse.  Pain Medicines:  Ibuprofen (e.g., Motrin, Advil):  Take 400 mg (two 200 mg pills) by mouth every 6 hours.  Another choice is to take 600 mg (three 200 mg pills) by mouth every 8 hours.  The most you should take each day is 1,200 mg (six 200 mg pills), unless your doctor has told you to take more.  Patient Will Follow Care Advice:  YES

## 2014-10-11 ENCOUNTER — Ambulatory Visit (INDEPENDENT_AMBULATORY_CARE_PROVIDER_SITE_OTHER): Payer: 59 | Admitting: Family

## 2014-10-11 ENCOUNTER — Encounter: Payer: Self-pay | Admitting: Family

## 2014-10-11 VITALS — BP 106/72 | HR 96 | Temp 97.8°F | Resp 18 | Ht 64.5 in | Wt 142.4 lb

## 2014-10-11 DIAGNOSIS — J019 Acute sinusitis, unspecified: Secondary | ICD-10-CM

## 2014-10-11 MED ORDER — DOXYCYCLINE HYCLATE 100 MG PO TABS: 100.0000 mg | ORAL_TABLET | Freq: Two times a day (BID) | ORAL | Status: DC

## 2014-10-11 NOTE — Assessment & Plan Note (Signed)
Symptoms and exam consistent with bacterial sinusitis. Start doxycycline. Continue over-the-counter medications as needed symptom relief. Follow up if symptoms worsen or fail to improve.

## 2014-10-11 NOTE — Progress Notes (Signed)
   Subjective:    Patient ID: Christine Morgan, female    DOB: 02/19/1981, 33 y.o.   MRN: [id number]  Chief Complaint  Patient presents with  . Sinus Problem    Facial pressure, headache, sore throat, ear pain x1 month    HPI:  Christine Morgan is a 33 y.o. female who presents today for an acute visit.  Acute symptoms started approximately one month ago with facial pressure, headache, sore throat and ear pain. Denies any fevers or chills, nausea, vomiting, diarrhea, or cough. Has tried alkaseltzer cold and sinus, claritin-D and flonase. Over course of the month she indicates she is feeling a little bit better, but symptoms continue to linger.   Allergies  Allergen Reactions  . Penicillins    Current Outpatient Prescriptions on File Prior to Visit  Medication Sig Dispense Refill  . fexofenadine (ALLEGRA ALLERGY) 180 MG tablet Take 180 mg by mouth daily.      [name]. ibuprofen (ADVIL,MOTRIN) 200 MG tablet Take 200 mg by mouth every 6 (six) hours as needed.      . norethindrone-ethinyl estradiol (MICROGESTIN FE 1/20) 1-20 MG-MCG tablet TAKE 1 TABLET BY MOUTH EVERY DAY 84 tablet 0  . SUMAtriptan (IMITREX) 100 MG tablet Take 1 tablet (100 mg total) by mouth once as needed. 10 tablet 2  . triamcinolone cream (KENALOG) 0.1 % Apply 1 application topically 2 (two) times daily. 30 g 0  . VENTOLIN HFA 108 (90 BASE) MCG/ACT inhaler INHALE 2 PUFFS BY MOUTH EVERY 4 HOURS AS NEEDED FOR BREATHING 18 g 1   No current facility-administered medications on file prior to visit.   Review of Systems    See HPI Objective:    BP 106/72 mmHg  Pulse 96  Temp(Src) 97.8 F (36.6 C) (Oral)  Resp 18  Ht 5' 4.5" (1.638 m)  Wt 142 lb 6.4 oz (64.592 kg)  BMI 24.07 kg/m2  SpO2 99% Nursing note and vital signs reviewed.  Physical Exam  Constitutional: She is oriented to person, place, and time. She appears well-developed and well-nourished. No distress.  HENT:  Right Ear: Hearing, tympanic membrane, external ear  and ear canal normal.  Left Ear: Hearing, tympanic membrane, external ear and ear canal normal.  Nose: Right sinus exhibits maxillary sinus tenderness and frontal sinus tenderness. Left sinus exhibits maxillary sinus tenderness and frontal sinus tenderness.  Mouth/Throat: Posterior oropharyngeal erythema present.  Cardiovascular: Normal rate, regular rhythm, normal heart sounds and intact distal pulses.   Pulmonary/Chest: Effort normal and breath sounds normal.  Lymphadenopathy:    She has cervical adenopathy.  Neurological: She is alert and oriented to person, place, and time.  Skin: Skin is warm and dry.  Psychiatric: She has a normal mood and affect. Her behavior is normal. Judgment and thought content normal.       Assessment & Plan:

## 2014-10-11 NOTE — Progress Notes (Signed)
Pre visit review using our clinic review tool, if applicable. No additional management support is needed unless otherwise documented below in the visit note. 

## 2014-10-11 NOTE — Patient Instructions (Signed)
Thank you for choosing [facility].  Summary/Instructions:  Your prescription(s) have been submitted to your pharmacy. Please take as directed and contact our office if you believe you are having problem(s) with the medication(s).  If your symptoms worsen or fail to improve, please contact our office for further instruction, or in case of emergency go directly to the emergency room at the closest medical facility.   Sinusitis Sinusitis is redness, soreness, and inflammation of the paranasal sinuses. Paranasal sinuses are air pockets within the bones of your face (beneath the eyes, the middle of the forehead, or above the eyes). In healthy paranasal sinuses, mucus is able to drain out, and air is able to circulate through them by way of your nose. However, when your paranasal sinuses are inflamed, mucus and air can become trapped. This can allow bacteria and other germs to grow and cause infection. Sinusitis can develop quickly and last only a short time (acute) or continue over a long period (chronic). Sinusitis that lasts for more than 12 weeks is considered chronic.  CAUSES  Causes of sinusitis include:  Allergies.  Structural abnormalities, such as displacement of the cartilage that separates your nostrils (deviated septum), which can decrease the air flow through your nose and sinuses and affect sinus drainage.  Functional abnormalities, such as when the small hairs (cilia) that line your sinuses and help remove mucus do not work properly or are not present. SIGNS AND SYMPTOMS  Symptoms of acute and chronic sinusitis are the same. The primary symptoms are pain and pressure around the affected sinuses. Other symptoms include:  Upper toothache.  Earache.  Headache.  Bad breath.  Decreased sense of smell and taste.  A cough, which worsens when you are lying flat.  Fatigue.  Fever.  Thick drainage from your nose, which often is green and may contain pus  (purulent).  Swelling and warmth over the affected sinuses. DIAGNOSIS  Your health care provider will perform a physical exam. During the exam, your health care provider may:  Look in your nose for signs of abnormal growths in your nostrils (nasal polyps).  Tap over the affected sinus to check for signs of infection.  View the inside of your sinuses (endoscopy) using an imaging device that has a light attached (endoscope). If your health care provider suspects that you have chronic sinusitis, one or more of the following tests may be recommended:  Allergy tests.  Nasal culture. A sample of mucus is taken from your nose, sent to a lab, and screened for bacteria.  Nasal cytology. A sample of mucus is taken from your nose and examined by your health care provider to determine if your sinusitis is related to an allergy. TREATMENT  Most cases of acute sinusitis are related to a viral infection and will resolve on their own within 10 days. Sometimes medicines are prescribed to help relieve symptoms (pain medicine, decongestants, nasal steroid sprays, or saline sprays).  However, for sinusitis related to a bacterial infection, your health care provider will prescribe antibiotic medicines. These are medicines that will help kill the bacteria causing the infection.  Rarely, sinusitis is caused by a fungal infection. In theses cases, your health care provider will prescribe antifungal medicine. For some cases of chronic sinusitis, surgery is needed. Generally, these are cases in which sinusitis recurs more than 3 times per year, despite other treatments. HOME CARE INSTRUCTIONS   Drink plenty of water. Water helps thin the mucus so your sinuses can drain more easily.    Use a humidifier.  Inhale steam 3 to 4 times a day (for example, sit in the bathroom with the shower running).  Apply a warm, moist washcloth to your face 3 to 4 times a day, or as directed by your health care provider.  Use  saline nasal sprays to help moisten and clean your sinuses.  Take medicines only as directed by your health care provider.  If you were prescribed either an antibiotic or antifungal medicine, finish it all even if you start to feel better. SEEK IMMEDIATE MEDICAL CARE IF:  You have increasing pain or severe headaches.  You have nausea, vomiting, or drowsiness.  You have swelling around your face.  You have vision problems.  You have a stiff neck.  You have difficulty breathing. MAKE SURE YOU:   Understand these instructions.  Will watch your condition.  Will get help right away if you are not doing well or get worse. Document Released: 10/26/2005 Document Revised: 03/12/2014 Document Reviewed: 11/10/2011 Exit[employer] Patient Information 2015 [employer]. This information is not intended to replace advice given to you by your health care provider. Make sure you discuss any questions you have with your health care provider.  

## 2014-11-05 ENCOUNTER — Encounter: Payer: Self-pay | Admitting: *Deleted

## 2014-11-06 ENCOUNTER — Encounter: Payer: Self-pay | Admitting: Obstetrics & Gynecology

## 2015-02-28 ENCOUNTER — Other Ambulatory Visit: Payer: Self-pay

## 2015-02-28 MED ORDER — ALBUTEROL SULFATE HFA 108 (90 BASE) MCG/ACT IN AERS: 2.0000 | INHALATION_SPRAY | RESPIRATORY_TRACT | Status: DC | PRN

## 2015-06-20 ENCOUNTER — Telehealth: Payer: Self-pay | Admitting: Internal Medicine

## 2015-06-20 NOTE — Telephone Encounter (Signed)
PLEASE NOTE: All timestamps contained within this report are represented as [place] Standard Time. CONFIDENTIALTY NOTICE: This fax transmission is intended only for the addressee. It contains information that is legally privileged, confidential or otherwise protected from use or disclosure. If you are not the intended recipient, you are strictly prohibited from reviewing, disclosing, copying using or disseminating any of this information or taking any action in reliance on or regarding this information. If you have received this fax in error, please notify us immediately by telephone so that we can arrange for its return to [place]. Phone: [phone], Toll-Free: [phone], Fax: [phone] Page: 1 of 1 Call Id: [id number] [facility] - Client TELEPHONE ADVICE RECORD [facility] Patient Name: Christine Morgan DOB: 04/07/1981 Initial Comment Caller states having headache for a week; both eyes twitching in different ways; neck pain now behind ear; migraine not working; Nurse Assessment Nurse: Chrys Racer, RN, Alexia Freestone Date/Time Lamount Cohen Time): 06/20/2015 11:49:29 AM Confirm and document reason for call. If symptomatic, describe symptoms. ---Caller states having headache for a week; both eyes twitching in different ways; neck pain now behind ear; migraine not working; the last 2 days have been worse. Pt is taking Immitrex, as well as Motrin, both of which are not working.Pt swallowing well. No breathing difficulty. No facial asymmetry. Pt walking w/ o difficulty. Pt is calling from work right now. Has the patient traveled out of the country within the last 30 days? ---No Does the patient require triage? ---Yes Related visit to physician within the last 2 weeks? ---No Does the PT have any chronic conditions? (i.e. diabetes, asthma, etc.) ---Yes List chronic conditions. ---Migraines, asthma Did the patient indicate they were pregnant?  ---No Guidelines Guideline Title Affirmed Question Affirmed Notes Headache [1] SEVERE headache (e.g., excruciating) AND [2] not improved after 2 hours of pain medicine Final Disposition User See Physician within 4 Hours (or PCP triage) Chrys Racer, RN, Alexia Freestone Appt unavailable at [facility] over next 4 hrs, and pt then wanting to be seen at "her Urgent care"

## 2015-08-30 ENCOUNTER — Other Ambulatory Visit (INDEPENDENT_AMBULATORY_CARE_PROVIDER_SITE_OTHER): Payer: 59

## 2015-08-30 ENCOUNTER — Ambulatory Visit (INDEPENDENT_AMBULATORY_CARE_PROVIDER_SITE_OTHER): Payer: 59 | Admitting: Family

## 2015-08-30 ENCOUNTER — Encounter: Payer: Self-pay | Admitting: Family

## 2015-08-30 VITALS — BP 104/78 | HR 90 | Temp 98.0°F | Resp 18 | Ht 64.5 in | Wt 143.4 lb

## 2015-08-30 DIAGNOSIS — E875 Hyperkalemia: Secondary | ICD-10-CM

## 2015-08-30 DIAGNOSIS — Z0000 Encounter for general adult medical examination without abnormal findings: Secondary | ICD-10-CM

## 2015-08-30 DIAGNOSIS — Z23 Encounter for immunization: Secondary | ICD-10-CM | POA: Diagnosis not present

## 2015-08-30 DIAGNOSIS — G43809 Other migraine, not intractable, without status migrainosus: Secondary | ICD-10-CM

## 2015-08-30 LAB — COMPREHENSIVE METABOLIC PANEL
ALT: 13 U/L (ref 0–35)
AST: 20 U/L (ref 0–37)
Albumin: 3.9 g/dL (ref 3.5–5.2)
Alkaline Phosphatase: 76 U/L (ref 39–117)
BUN: 10 mg/dL (ref 6–23)
CALCIUM: 9.9 mg/dL (ref 8.4–10.5)
CHLORIDE: 107 meq/L (ref 96–112)
CO2: 27 meq/L (ref 19–32)
Creatinine, Ser: 1.05 mg/dL (ref 0.40–1.20)
GFR: 78.06 mL/min (ref >60.00)
GLUCOSE: 91 mg/dL (ref 70–99)
Potassium: 5.8 mEq/L — ABNORMAL HIGH (ref 3.5–5.1)
Sodium: 142 mEq/L (ref 135–145)
Total Bilirubin: 0.5 mg/dL (ref 0.2–1.2)
Total Protein: 7.9 g/dL (ref 6.0–8.3)

## 2015-08-30 LAB — CBC
HCT: 41.9 % (ref 36.0–46.0)
HEMOGLOBIN: 13.9 g/dL (ref 12.0–15.0)
MCHC: 33.1 g/dL (ref 30.0–36.0)
MCV: 94.1 fl (ref 78.0–100.0)
PLATELETS: 318 10*3/uL (ref 150.0–400.0)
RBC: 4.46 Mil/uL (ref 3.87–5.11)
RDW: 13.3 % (ref 11.5–15.5)
WBC: 8.6 10*3/uL (ref 4.0–10.5)

## 2015-08-30 LAB — LIPID PANEL
CHOL/HDL RATIO: 3
Cholesterol: 152 mg/dL (ref 0–200)
HDL: 59.5 mg/dL (ref >39.00)
LDL CALC: 80 mg/dL (ref 0–99)
NonHDL: 92.71
TRIGLYCERIDES: 65 mg/dL (ref 0.0–149.0)
VLDL: 13 mg/dL (ref 0.0–40.0)

## 2015-08-30 LAB — TSH: TSH: 1.67 u[IU]/mL (ref 0.35–4.50)

## 2015-08-30 MED ORDER — SUMATRIPTAN SUCCINATE 100 MG PO TABS: 100.0000 mg | ORAL_TABLET | Freq: Once | ORAL | Status: DC | PRN

## 2015-08-30 NOTE — Progress Notes (Signed)
Subjective:    Patient ID: Christine Morgan, female    DOB: September 02, 1981, 34 y.o.   MRN: [id number]  Chief Complaint  Patient presents with  . CPE    Fasting, refill of imitrex    HPI:  Christine Morgan is a 34 y.o. female who presents today for an annual wellness visit.   1) Health Maintenance -   Diet - Eats a regular diet; Averages about 5 meals per day with snack; Consists of chicken, vegetables, fish, fruit, and some carbs. Some cheese and butter. About 1-2 cups of coffee 3 times per week.   Exercise - 4x times per week with some cardio and resistance training   2) Preventative Exams / Immunizations:  Dental -- Up to date  Vision -- Up to date   Health Maintenance  Topic Date Due  . HIV Screening  07/25/1998  . TETANUS/TDAP  04/10/2015  . INFLUENZA VACCINE  07/24/2016 (Originally 06/10/2015)  . PAP SMEAR  08/03/2016     There is no immunization history on file for this patient.  Allergies  Allergen Reactions  . Penicillins      Outpatient Prescriptions Prior to Visit  Medication Sig Dispense Refill  . albuterol (VENTOLIN HFA) 108 (90 BASE) MCG/ACT inhaler Inhale 2 puffs into the lungs every 4 (four) hours as needed for wheezing or shortness of breath. 18 g 3  . fexofenadine (ALLEGRA ALLERGY) 180 MG tablet Take 180 mg by mouth daily.      [name] ibuprofen (ADVIL,MOTRIN) 200 MG tablet Take 200 mg by mouth every 6 (six) hours as needed.      . SUMAtriptan (IMITREX) 100 MG tablet Take 1 tablet (100 mg total) by mouth once as needed. 10 tablet 2  . doxycycline (VIBRA-TABS) 100 MG tablet Take 1 tablet (100 mg total) by mouth 2 (two) times daily. 20 tablet 0  . norethindrone-ethinyl estradiol (MICROGESTIN FE 1/20) 1-20 MG-MCG tablet TAKE 1 TABLET BY MOUTH EVERY DAY 84 tablet 0  . triamcinolone cream (KENALOG) 0.1 % Apply 1 application topically 2 (two) times daily. 30 g 0   No facility-administered medications prior to visit.     Past Medical History  Diagnosis Date    . MIGRAINE HEADACHE   . Irritable bowel syndrome   . ALLERGY, INGESTED FOOD, DERMATITIS   . ALLERGIC RHINITIS   . ASTHMA   . PCOS (polycystic ovarian syndrome)      Past Surgical History  Procedure Laterality Date  . Gynecologic cryosurgery       Family History  Problem Relation Age of Onset  . Arthritis Mother   . Hyperlipidemia Father   . Hypertension Father   . Arthritis Maternal Grandmother   . Cancer Maternal Grandmother   . Arthritis Paternal Grandmother   . Heart disease Paternal Grandmother   . Liver disease Paternal Grandfather      Social History   Social History  . Marital Status: Single    Spouse Name: N/A  . Number of Children: 0  . Years of Education: 18   Occupational History  . Marketing    Social History Main Topics  . Smoking status: Never Smoker   . Smokeless tobacco: Never Used     Comment: Single-has boyfriend. Employed Architectural technologist at [employer]  . Alcohol Use: Yes     Comment: Occ.  . Drug Use: No  . Sexual Activity: Yes    Birth Control/ Protection: Pill   Other Topics Concern  . Not on file  Social History Narrative   Denies abuse and feels safe at home.    Review of Systems  Constitutional: Denies fever, chills, fatigue, or significant weight gain/loss. HENT: Head: Denies headache or neck pain Ears: Denies changes in hearing, ringing in ears, earache, drainage Nose: Denies discharge, stuffiness, itching, nosebleed, sinus pain Throat: Denies sore throat, hoarseness, dry mouth, sores, thrush Eyes: Denies loss/changes in vision, pain, redness, blurry/double vision, flashing lights Cardiovascular: Denies chest pain/discomfort, tightness, palpitations, shortness of breath with activity, difficulty lying down, swelling, sudden awakening with shortness of breath Respiratory: Denies shortness of breath, cough, sputum production, wheezing Gastrointestinal: Denies dysphasia, heartburn, change in appetite, nausea,  change in bowel habits, rectal bleeding, constipation, diarrhea, yellow skin or eyes Genitourinary: Denies frequency, urgency, burning/pain, blood in urine, incontinence, change in urinary strength. Musculoskeletal: Denies muscle/joint pain, stiffness, back pain, redness or swelling of joints, trauma Skin: Denies rashes, lumps, itching, dryness, color changes, or hair/nail changes Neurological: Denies dizziness, fainting, seizures, weakness, numbness, tingling, tremor Psychiatric - Denies nervousness, stress, depression or memory loss Endocrine: Denies heat or cold intolerance, sweating, frequent urination, excessive thirst, changes in appetite Hematologic: Denies ease of bruising or bleeding     Objective:     BP 104/78 mmHg  Pulse 90  Temp(Src) 98 F (36.7 C) (Oral)  Resp 18  Ht 5' 4.5" (1.638 m)  Wt 143 lb 6.4 oz (65.046 kg)  BMI 24.24 kg/m2  SpO2 98% Nursing note and vital signs reviewed.   Depression screen PHQ 2/9 08/30/2015  Decreased Interest 0  Down, Depressed, Hopeless 0  PHQ - 2 Score 0    Physical Exam  Constitutional: She is oriented to person, place, and time. She appears well-developed and well-nourished.  HENT:  Head: Normocephalic.  Right Ear: Hearing, tympanic membrane, external ear and ear canal normal.  Left Ear: Hearing, tympanic membrane, external ear and ear canal normal.  Nose: Nose normal.  Mouth/Throat: Uvula is midline, oropharynx is clear and moist and mucous membranes are normal.  Eyes: Conjunctivae and EOM are normal. Pupils are equal, round, and reactive to light.  Neck: Neck supple. No JVD present. No tracheal deviation present. No thyromegaly present.  Cardiovascular: Normal rate, regular rhythm, normal heart sounds and intact distal pulses.   Pulmonary/Chest: Effort normal and breath sounds normal.  Abdominal: Soft. Bowel sounds are normal. She exhibits no distension and no mass. There is no tenderness. There is no rebound and no guarding.   Musculoskeletal: Normal range of motion. She exhibits no edema or tenderness.  Lymphadenopathy:    She has no cervical adenopathy.  Neurological: She is alert and oriented to person, place, and time. She has normal reflexes. No cranial nerve deficit. She exhibits normal muscle tone. Coordination normal.  Skin: Skin is warm and dry.  Psychiatric: She has a normal mood and affect. Her behavior is normal. Judgment and thought content normal.       Assessment & Plan:   Problem List Items Addressed This Visit      Cardiovascular and Mediastinum   Migraine headache   Relevant Medications   SUMAtriptan (IMITREX) 100 MG tablet     Other   Routine general medical examination at a health care facility - Primary    1) Anticipatory Guidance: Discussed importance of wearing a seatbelt while driving and not texting while driving; changing batteries in smoke detector at least once annually; wearing suntan lotion when outside; eating a balanced and moderate diet; getting physical activity at least 30 minutes per day.  2) Immunizations / Screenings / Labs:  Tetanus updated today. Declines flu shot. All other immunizations are up-to-date per recommendations. All screenings are up-to-date per recommendations. Obtain CBC, BMET, Lipid profile and TSH.   Overall well exam. No major risk factors for cardiovascular disease noted at this time. Continue current healthy lifestyle behaviors and choices as she is of good weight and exercises regularly. Emphasize importance of continued nutrient density to maintain weight. Follow-up prevention visit in one year. Follow-up office visit pending blood work.       Relevant Orders   CBC   Comprehensive metabolic panel   Lipid panel   TSH

## 2015-08-30 NOTE — Patient Instructions (Addendum)
Thank you for choosing [facility].  Summary/Instructions:  Your prescription(s) have been submitted to your pharmacy or been printed and provided for you. Please take as directed and contact our office if you believe you are having problem(s) with the medication(s) or have any questions.  Please stop by the lab on the basement level of the building for your blood work. Your results will be released to MyChart (or called to you) after review, usually within 72 hours after test completion. If any changes need to be made, you will be notified at that same time.  Health Maintenance, Female Adopting a healthy lifestyle and getting preventive care can go a long way to promote health and wellness. Talk with your health care provider about what schedule of regular examinations is right for you. This is a good chance for you to check in with your provider about disease prevention and staying healthy. In between checkups, there are plenty of things you can do on your own. Experts have done a lot of research about which lifestyle changes and preventive measures are most likely to keep you healthy. Ask your health care provider for more information. WEIGHT AND DIET  Eat a healthy diet  Be sure to include plenty of vegetables, fruits, low-fat dairy products, and lean protein.  Do not eat a lot of foods high in solid fats, added sugars, or salt.  Get regular exercise. This is one of the most important things you can do for your health.  Most adults should exercise for at least 150 minutes each week. The exercise should increase your heart rate and make you sweat (moderate-intensity exercise).  Most adults should also do strengthening exercises at least twice a week. This is in addition to the moderate-intensity exercise.  Maintain a healthy weight  Body mass index (BMI) is a measurement that can be used to identify possible weight problems. It estimates body fat based on height and weight. Your  health care provider can help determine your BMI and help you achieve or maintain a healthy weight.  For females 20 years of age and older:   A BMI below 18.5 is considered underweight.  A BMI of 18.5 to 24.9 is normal.  A BMI of 25 to 29.9 is considered overweight.  A BMI of 30 and above is considered obese.  Watch levels of cholesterol and blood lipids  You should start having your blood tested for lipids and cholesterol at 32 years of age, then have this test every 5 years.  You may need to have your cholesterol levels checked more often if:  Your lipid or cholesterol levels are high.  You are older than 32 years of age.  You are at high risk for heart disease.  CANCER SCREENING   Lung Cancer  Lung cancer screening is recommended for adults 55-80 years old who are at high risk for lung cancer because of a history of smoking.  A yearly low-dose CT scan of the lungs is recommended for people who:  Currently smoke.  Have quit within the past 15 years.  Have at least a 30-pack-year history of smoking. A pack year is smoking an average of one pack of cigarettes a day for 1 year.  Yearly screening should continue until it has been 15 years since you quit.  Yearly screening should stop if you develop a health problem that would prevent you from having lung cancer treatment.  Breast Cancer  Practice breast self-awareness. This means understanding how your breasts normally   appear and feel.  It also means doing regular breast self-exams. Let your health care provider know about any changes, no matter how small.  If you are in your 20s or 30s, you should have a clinical breast exam (CBE) by a health care provider every 1-3 years as part of a regular health exam.  If you are 40 or older, have a CBE every year. Also consider having a breast X-ray (mammogram) every year.  If you have a family history of breast cancer, talk to your health care provider about genetic  screening.  If you are at high risk for breast cancer, talk to your health care provider about having an MRI and a mammogram every year.  Breast cancer gene (BRCA) assessment is recommended for women who have family members with BRCA-related cancers. BRCA-related cancers include:  Breast.  Ovarian.  Tubal.  Peritoneal cancers.  Results of the assessment will determine the need for genetic counseling and BRCA1 and BRCA2 testing. Cervical Cancer Your health care provider may recommend that you be screened regularly for cancer of the pelvic organs (ovaries, uterus, and vagina). This screening involves a pelvic examination, including checking for microscopic changes to the surface of your cervix (Pap test). You may be encouraged to have this screening done every 3 years, beginning at age 21.  For women ages 30-65, health care providers may recommend pelvic exams and Pap testing every 3 years, or they may recommend the Pap and pelvic exam, combined with testing for human papilloma virus (HPV), every 5 years. Some types of HPV increase your risk of cervical cancer. Testing for HPV may also be done on women of any age with unclear Pap test results.  Other health care providers may not recommend any screening for nonpregnant women who are considered low risk for pelvic cancer and who do not have symptoms. Ask your health care provider if a screening pelvic exam is right for you.  If you have had past treatment for cervical cancer or a condition that could lead to cancer, you need Pap tests and screening for cancer for at least 20 years after your treatment. If Pap tests have been discontinued, your risk factors (such as having a new sexual partner) need to be reassessed to determine if screening should resume. Some women have medical problems that increase the chance of getting cervical cancer. In these cases, your health care provider may recommend more frequent screening and Pap tests. Colorectal  Cancer  This type of cancer can be detected and often prevented.  Routine colorectal cancer screening usually begins at 32 years of age and continues through 32 years of age.  Your health care provider may recommend screening at an earlier age if you have risk factors for colon cancer.  Your health care provider may also recommend using home test kits to check for hidden blood in the stool.  A small camera at the end of a tube can be used to examine your colon directly (sigmoidoscopy or colonoscopy). This is done to check for the earliest forms of colorectal cancer.  Routine screening usually begins at age 50.  Direct examination of the colon should be repeated every 5-10 years through 32 years of age. However, you may need to be screened more often if early forms of precancerous polyps or small growths are found. Skin Cancer  Check your skin from head to toe regularly.  Tell your health care provider about any new moles or changes in moles, especially if there is   a change in a mole's shape or color.  Also tell your health care provider if you have a mole that is larger than the size of a pencil eraser.  Always use sunscreen. Apply sunscreen liberally and repeatedly throughout the day.  Protect yourself by wearing long sleeves, pants, a wide-brimmed hat, and sunglasses whenever you are outside. HEART DISEASE, DIABETES, AND HIGH BLOOD PRESSURE   High blood pressure causes heart disease and increases the risk of stroke. High blood pressure is more likely to develop in:  People who have blood pressure in the high end of the normal range (130-139/85-89 mm Hg).  People who are overweight or obese.  People who are African American.  If you are 7-72 years of age, have your blood pressure checked every 3-5 years. If you are 78 years of age or older, have your blood pressure checked every year. You should have your blood pressure measured twice--once when you are at a hospital or clinic,  and once when you are not at a hospital or clinic. Record the average of the two measurements. To check your blood pressure when you are not at a hospital or clinic, you can use:  An automated blood pressure machine at a pharmacy.  A home blood pressure monitor.  If you are between 40 years and 62 years old, ask your health care provider if you should take aspirin to prevent strokes.  Have regular diabetes screenings. This involves taking a blood sample to check your fasting blood sugar level.  If you are at a normal weight and have a low risk for diabetes, have this test once every three years after 32 years of age.  If you are overweight and have a high risk for diabetes, consider being tested at a younger age or more often. PREVENTING INFECTION  Hepatitis B  If you have a higher risk for hepatitis B, you should be screened for this virus. You are considered at high risk for hepatitis B if:  You were born in a country where hepatitis B is common. Ask your health care provider which countries are considered high risk.  Your parents were born in a high-risk country, and you have not been immunized against hepatitis B (hepatitis B vaccine).  You have HIV or AIDS.  You use needles to inject street drugs.  You live with someone who has hepatitis B.  You have had sex with someone who has hepatitis B.  You get hemodialysis treatment.  You take certain medicines for conditions, including cancer, organ transplantation, and autoimmune conditions. Hepatitis C  Blood testing is recommended for:  Everyone born from [birth year] through 1963.  Anyone with known risk factors for hepatitis C. Sexually transmitted infections (STIs)  You should be screened for sexually transmitted infections (STIs) including gonorrhea and chlamydia if:  You are sexually active and are younger than 32 years of age.  You are older than 32 years of age and your health care provider tells you that you are at risk  for this type of infection.  Your sexual activity has changed since you were last screened and you are at an increased risk for chlamydia or gonorrhea. Ask your health care provider if you are at risk.  If you do not have HIV, but are at risk, it may be recommended that you take a prescription medicine daily to prevent HIV infection. This is called pre-exposure prophylaxis (PrEP). You are considered at risk if:  You are sexually active and do not regularly  use condoms or know the HIV status of your partner(s).  You take drugs by injection.  You are sexually active with a partner who has HIV. Talk with your health care provider about whether you are at high risk of being infected with HIV. If you choose to begin PrEP, you should first be tested for HIV. You should then be tested every 3 months for as long as you are taking PrEP.  PREGNANCY   If you are premenopausal and you may become pregnant, ask your health care provider about preconception counseling.  If you may become pregnant, take 400 to 800 micrograms (mcg) of folic acid every day.  If you want to prevent pregnancy, talk to your health care provider about birth control (contraception). OSTEOPOROSIS AND MENOPAUSE   Osteoporosis is a disease in which the bones lose minerals and strength with aging. This can result in serious bone fractures. Your risk for osteoporosis can be identified using a bone density scan.  If you are 65 years of age or older, or if you are at risk for osteoporosis and fractures, ask your health care provider if you should be screened.  Ask your health care provider whether you should take a calcium or vitamin D supplement to lower your risk for osteoporosis.  Menopause may have certain physical symptoms and risks.  Hormone replacement therapy may reduce some of these symptoms and risks. Talk to your health care provider about whether hormone replacement therapy is right for you.  HOME CARE INSTRUCTIONS    Schedule regular health, dental, and eye exams.  Stay current with your immunizations.   Do not use any tobacco products including cigarettes, chewing tobacco, or electronic cigarettes.  If you are pregnant, do not drink alcohol.  If you are breastfeeding, limit how much and how often you drink alcohol.  Limit alcohol intake to no more than 1 drink per day for nonpregnant women. One drink equals 12 ounces of beer, 5 ounces of wine, or 1 ounces of hard liquor.  Do not use street drugs.  Do not share needles.  Ask your health care provider for help if you need support or information about quitting drugs.  Tell your health care provider if you often feel depressed.  Tell your health care provider if you have ever been abused or do not feel safe at home.   This information is not intended to replace advice given to you by your health care provider. Make sure you discuss any questions you have with your health care provider.   Document Released: 05/11/2011 Document Revised: 11/16/2014 Document Reviewed: 09/27/2013 Elsevier Interactive Patient Education 2016 [employer]   

## 2015-08-30 NOTE — Progress Notes (Signed)
Pre visit review using our clinic review tool, if applicable. No additional management support is needed unless otherwise documented below in the visit note.  Refused flu and received tetanus

## 2015-08-30 NOTE — Assessment & Plan Note (Signed)
1) Anticipatory Guidance: Discussed importance of wearing a seatbelt while driving and not texting while driving; changing batteries in smoke detector at least once annually; wearing suntan lotion when outside; eating a balanced and moderate diet; getting physical activity at least 30 minutes per day.  2) Immunizations / Screenings / Labs:  Tetanus updated today. Declines flu shot. All other immunizations are up-to-date per recommendations. All screenings are up-to-date per recommendations. Obtain CBC, BMET, Lipid profile and TSH.   Overall well exam. No major risk factors for cardiovascular disease noted at this time. Continue current healthy lifestyle behaviors and choices as she is of good weight and exercises regularly. Emphasize importance of continued nutrient density to maintain weight. Follow-up prevention visit in one year. Follow-up office visit pending blood work.

## 2015-09-01 ENCOUNTER — Encounter: Payer: Self-pay | Admitting: Family

## 2015-09-06 ENCOUNTER — Other Ambulatory Visit (INDEPENDENT_AMBULATORY_CARE_PROVIDER_SITE_OTHER): Payer: 59

## 2015-09-06 DIAGNOSIS — E875 Hyperkalemia: Secondary | ICD-10-CM | POA: Diagnosis not present

## 2015-09-06 LAB — POTASSIUM: Potassium: 3.9 mEq/L (ref 3.5–5.1)

## 2015-09-09 ENCOUNTER — Encounter: Payer: Self-pay | Admitting: Family

## 2016-09-02 ENCOUNTER — Encounter: Payer: Self-pay | Admitting: Family

## 2016-09-02 ENCOUNTER — Other Ambulatory Visit (INDEPENDENT_AMBULATORY_CARE_PROVIDER_SITE_OTHER): Payer: 59

## 2016-09-02 ENCOUNTER — Ambulatory Visit (INDEPENDENT_AMBULATORY_CARE_PROVIDER_SITE_OTHER): Payer: 59 | Admitting: Family

## 2016-09-02 VITALS — BP 120/80 | HR 78 | Temp 97.9°F | Resp 16 | Ht 64.5 in | Wt 147.1 lb

## 2016-09-02 DIAGNOSIS — Z0000 Encounter for general adult medical examination without abnormal findings: Secondary | ICD-10-CM | POA: Diagnosis not present

## 2016-09-02 LAB — LIPID PANEL
CHOL/HDL RATIO: 2
Cholesterol: 141 mg/dL (ref 0–200)
HDL: 62.9 mg/dL (ref >39.00)
LDL CALC: 61 mg/dL (ref 0–99)
NONHDL: 78.52
Triglycerides: 88 mg/dL (ref 0.0–149.0)
VLDL: 17.6 mg/dL (ref 0.0–40.0)

## 2016-09-02 LAB — COMPREHENSIVE METABOLIC PANEL
ALK PHOS: 75 U/L (ref 39–117)
ALT: 10 U/L (ref 0–35)
AST: 14 U/L (ref 0–37)
Albumin: 4.2 g/dL (ref 3.5–5.2)
BUN: 5 mg/dL — AB (ref 6–23)
CHLORIDE: 105 meq/L (ref 96–112)
CO2: 27 meq/L (ref 19–32)
Calcium: 9.7 mg/dL (ref 8.4–10.5)
Creatinine, Ser: 0.83 mg/dL (ref 0.40–1.20)
GFR: 101.75 mL/min (ref >60.00)
GLUCOSE: 93 mg/dL (ref 70–99)
POTASSIUM: 3.9 meq/L (ref 3.5–5.1)
SODIUM: 141 meq/L (ref 135–145)
TOTAL PROTEIN: 7.6 g/dL (ref 6.0–8.3)
Total Bilirubin: 0.6 mg/dL (ref 0.2–1.2)

## 2016-09-02 LAB — CBC
HEMATOCRIT: 41.4 % (ref 36.0–46.0)
HEMOGLOBIN: 13.9 g/dL (ref 12.0–15.0)
MCHC: 33.5 g/dL (ref 30.0–36.0)
MCV: 93.4 fl (ref 78.0–100.0)
Platelets: 271 10*3/uL (ref 150.0–400.0)
RBC: 4.43 Mil/uL (ref 3.87–5.11)
RDW: 13.3 % (ref 11.5–15.5)
WBC: 8.1 10*3/uL (ref 4.0–10.5)

## 2016-09-02 LAB — VITAMIN D 25 HYDROXY (VIT D DEFICIENCY, FRACTURES): VITD: 20.62 ng/mL — AB (ref 30.00–100.00)

## 2016-09-02 NOTE — Patient Instructions (Addendum)
Thank you for choosing [facility].  SUMMARY AND INSTRUCTIONS:  Try IBGuard if needed.   Medication:  Please continue to take your medication as prescribed.   Your prescription(s) have been submitted to your pharmacy or been printed and provided for you. Please take as directed and contact our office if you believe you are having problem(s) with the medication(s) or have any questions.  Labs:  Please stop by the lab on the lower level of the building for your blood work. Your results will be released to [place] (or called to you) after review, usually within 72 hours after test completion. If any changes need to be made, you will be notified at that same time.  1.) The lab is open from 7:30am to 5:30 pm Monday-Friday 2.) No appointment is necessary 3.) Fasting (if needed) is 6-8 hours after food and drink; black coffee and water are okay   Follow up:  If your symptoms worsen or fail to improve, please contact our office for further instruction, or in case of emergency go directly to the emergency room at the closest medical facility.    Health Maintenance, Female Adopting a healthy lifestyle and getting preventive care can go a long way to promote health and wellness. Talk with your health care provider about what schedule of regular examinations is right for you. This is a good chance for you to check in with your provider about disease prevention and staying healthy. In between checkups, there are plenty of things you can do on your own. Experts have done a lot of research about which lifestyle changes and preventive measures are most likely to keep you healthy. Ask your health care provider for more information. WEIGHT AND DIET  Eat a healthy diet  Be sure to include plenty of vegetables, fruits, low-fat dairy products, and lean protein.  Do not eat a lot of foods high in solid fats, added sugars, or salt.  Get regular exercise. This is one of the most important things  you can do for your health.  Most adults should exercise for at least 150 minutes each week. The exercise should increase your heart rate and make you sweat (moderate-intensity exercise).  Most adults should also do strengthening exercises at least twice a week. This is in addition to the moderate-intensity exercise.  Maintain a healthy weight  Body mass index (BMI) is a measurement that can be used to identify possible weight problems. It estimates body fat based on height and weight. Your health care provider can help determine your BMI and help you achieve or maintain a healthy weight.  For females 72 years of age and older:   A BMI below 18.5 is considered underweight.  A BMI of 18.5 to 24.9 is normal.  A BMI of 25 to 29.9 is considered overweight.  A BMI of 30 and above is considered obese.  Watch levels of cholesterol and blood lipids  You should start having your blood tested for lipids and cholesterol at 33 years of age, then have this test every 5 years.  You may need to have your cholesterol levels checked more often if:  Your lipid or cholesterol levels are high.  You are older than 33 years of age.  You are at high risk for heart disease.  CANCER SCREENING   Lung Cancer  Lung cancer screening is recommended for adults 18-64 years old who are at high risk for lung cancer because of a history of smoking.  A yearly low-dose CT  scan of the lungs is recommended for people who:  Currently smoke.  Have quit within the past 15 years.  Have at least a 30-pack-year history of smoking. A pack year is smoking an average of one pack of cigarettes a day for 1 year.  Yearly screening should continue until it has been 15 years since you quit.  Yearly screening should stop if you develop a health problem that would prevent you from having lung cancer treatment.  Breast Cancer  Practice breast self-awareness. This means understanding how your breasts normally appear  and feel.  It also means doing regular breast self-exams. Let your health care provider know about any changes, no matter how small.  If you are in your 20s or 30s, you should have a clinical breast exam (CBE) by a health care provider every 1-3 years as part of a regular health exam.  If you are 62 or older, have a CBE every year. Also consider having a breast X-ray (mammogram) every year.  If you have a family history of breast cancer, talk to your health care provider about genetic screening.  If you are at high risk for breast cancer, talk to your health care provider about having an MRI and a mammogram every year.  Breast cancer gene (BRCA) assessment is recommended for women who have family members with BRCA-related cancers. BRCA-related cancers include:  Breast.  Ovarian.  Tubal.  Peritoneal cancers.  Results of the assessment will determine the need for genetic counseling and BRCA1 and BRCA2 testing. Cervical Cancer Your health care provider may recommend that you be screened regularly for cancer of the pelvic organs (ovaries, uterus, and vagina). This screening involves a pelvic examination, including checking for microscopic changes to the surface of your cervix (Pap test). You may be encouraged to have this screening done every 3 years, beginning at age 71.  For women ages 64-65, health care providers may recommend pelvic exams and Pap testing every 3 years, or they may recommend the Pap and pelvic exam, combined with testing for human papilloma virus (HPV), every 5 years. Some types of HPV increase your risk of cervical cancer. Testing for HPV may also be done on women of any age with unclear Pap test results.  Other health care providers may not recommend any screening for nonpregnant women who are considered low risk for pelvic cancer and who do not have symptoms. Ask your health care provider if a screening pelvic exam is right for you.  If you have had past treatment for  cervical cancer or a condition that could lead to cancer, you need Pap tests and screening for cancer for at least 20 years after your treatment. If Pap tests have been discontinued, your risk factors (such as having a new sexual partner) need to be reassessed to determine if screening should resume. Some women have medical problems that increase the chance of getting cervical cancer. In these cases, your health care provider may recommend more frequent screening and Pap tests. Colorectal Cancer  This type of cancer can be detected and often prevented.  Routine colorectal cancer screening usually begins at 33 years of age and continues through 33 years of age.  Your health care provider may recommend screening at an earlier age if you have risk factors for colon cancer.  Your health care provider may also recommend using home test kits to check for hidden blood in the stool.  A small camera at the end of a tube can be used  to examine your colon directly (sigmoidoscopy or colonoscopy). This is done to check for the earliest forms of colorectal cancer.  Routine screening usually begins at age 8.  Direct examination of the colon should be repeated every 5-10 years through 33 years of age. However, you may need to be screened more often if early forms of precancerous polyps or small growths are found. Skin Cancer  Check your skin from head to toe regularly.  Tell your health care provider about any new moles or changes in moles, especially if there is a change in a mole's shape or color.  Also tell your health care provider if you have a mole that is larger than the size of a pencil eraser.  Always use sunscreen. Apply sunscreen liberally and repeatedly throughout the day.  Protect yourself by wearing long sleeves, pants, a wide-brimmed hat, and sunglasses whenever you are outside. HEART DISEASE, DIABETES, AND HIGH BLOOD PRESSURE   High blood pressure causes heart disease and increases the  risk of stroke. High blood pressure is more likely to develop in:  People who have blood pressure in the high end of the normal range (130-139/85-89 mm Hg).  People who are overweight or obese.  People who are African American.  If you are 86-3 years of age, have your blood pressure checked every 3-5 years. If you are 3 years of age or older, have your blood pressure checked every year. You should have your blood pressure measured twice--once when you are at a hospital or clinic, and once when you are not at a hospital or clinic. Record the average of the two measurements. To check your blood pressure when you are not at a hospital or clinic, you can use:  An automated blood pressure machine at a pharmacy.  A home blood pressure monitor.  If you are between 52 years and 22 years old, ask your health care provider if you should take aspirin to prevent strokes.  Have regular diabetes screenings. This involves taking a blood sample to check your fasting blood sugar level.  If you are at a normal weight and have a low risk for diabetes, have this test once every three years after 33 years of age.  If you are overweight and have a high risk for diabetes, consider being tested at a younger age or more often. PREVENTING INFECTION  Hepatitis B  If you have a higher risk for hepatitis B, you should be screened for this virus. You are considered at high risk for hepatitis B if:  You were born in a country where hepatitis B is common. Ask your health care provider which countries are considered high risk.  Your parents were born in a high-risk country, and you have not been immunized against hepatitis B (hepatitis B vaccine).  You have HIV or AIDS.  You use needles to inject street drugs.  You live with someone who has hepatitis B.  You have had sex with someone who has hepatitis B.  You get hemodialysis treatment.  You take certain medicines for conditions, including cancer, organ  transplantation, and autoimmune conditions. Hepatitis C  Blood testing is recommended for:  Everyone born from [birth year] through 1963.  Anyone with known risk factors for hepatitis C. Sexually transmitted infections (STIs)  You should be screened for sexually transmitted infections (STIs) including gonorrhea and chlamydia if:  You are sexually active and are younger than 33 years of age.  You are older than 33 years of age and your  health care provider tells you that you are at risk for this type of infection.  Your sexual activity has changed since you were last screened and you are at an increased risk for chlamydia or gonorrhea. Ask your health care provider if you are at risk.  If you do not have HIV, but are at risk, it may be recommended that you take a prescription medicine daily to prevent HIV infection. This is called pre-exposure prophylaxis (PrEP). You are considered at risk if:  You are sexually active and do not regularly use condoms or know the HIV status of your partner(s).  You take drugs by injection.  You are sexually active with a partner who has HIV. Talk with your health care provider about whether you are at high risk of being infected with HIV. If you choose to begin PrEP, you should first be tested for HIV. You should then be tested every 3 months for as long as you are taking PrEP.  PREGNANCY   If you are premenopausal and you may become pregnant, ask your health care provider about preconception counseling.  If you may become pregnant, take 400 to 800 micrograms (mcg) of folic acid every day.  If you want to prevent pregnancy, talk to your health care provider about birth control (contraception). OSTEOPOROSIS AND MENOPAUSE   Osteoporosis is a disease in which the bones lose minerals and strength with aging. This can result in serious bone fractures. Your risk for osteoporosis can be identified using a bone density scan.  If you are 66 years of age or older,  or if you are at risk for osteoporosis and fractures, ask your health care provider if you should be screened.  Ask your health care provider whether you should take a calcium or vitamin D supplement to lower your risk for osteoporosis.  Menopause may have certain physical symptoms and risks.  Hormone replacement therapy may reduce some of these symptoms and risks. Talk to your health care provider about whether hormone replacement therapy is right for you.  HOME CARE INSTRUCTIONS   Schedule regular health, dental, and eye exams.  Stay current with your immunizations.   Do not use any tobacco products including cigarettes, chewing tobacco, or electronic cigarettes.  If you are pregnant, do not drink alcohol.  If you are breastfeeding, limit how much and how often you drink alcohol.  Limit alcohol intake to no more than 1 drink per day for nonpregnant women. One drink equals 12 ounces of beer, 5 ounces of wine, or 1 ounces of hard liquor.  Do not use street drugs.  Do not share needles.  Ask your health care provider for help if you need support or information about quitting drugs.  Tell your health care provider if you often feel depressed.  Tell your health care provider if you have ever been abused or do not feel safe at home.   This information is not intended to replace advice given to you by your health care provider. Make sure you discuss any questions you have with your health care provider.   Document Released: 05/11/2011 Document Revised: 11/16/2014 Document Reviewed: 09/27/2013 Elsevier Interactive Patient Education [employer].

## 2016-09-02 NOTE — Progress Notes (Signed)
Subjective:    Patient ID: Christine Morgan, female    DOB: January 16, 1981, 35 y.o.   MRN: [id number]  Chief Complaint  Patient presents with  . CPE    fasting    HPI:  Christine Morgan is a 35 y.o. female who presents today for an annual wellness visit.   1) Health Maintenance -   Diet - Averaging about 4 small meals per day consisting of a regular diet; Caffeine intake of about 2-3 cups per week.  Exercise - 4-5x per week consisting of resistance training and some cardio.    2) Preventative Exams / Immunizations:  Dental -- Up to date  Vision -- Up to date   Health Maintenance  Topic Date Due  . HIV Screening  07/25/1998  . INFLUENZA VACCINE  02/06/2017 (Originally 06/09/2016)  . PAP SMEAR  09/09/2018  . TETANUS/TDAP  08/29/2025    Immunization History  Administered Date(s) Administered  . Tdap 08/30/2015     Allergies  Allergen Reactions  . Penicillins      Outpatient Medications Prior to Visit  Medication Sig Dispense Refill  . albuterol (VENTOLIN HFA) 108 (90 BASE) MCG/ACT inhaler Inhale 2 puffs into the lungs every 4 (four) hours as needed for wheezing or shortness of breath. 18 g 3  . Ascorbic Acid (VITAMIN C) 100 MG tablet Take 100 mg by mouth daily.    . Biotin 1 MG CAPS Take by mouth.    . COD LIVER OIL PO Take by mouth.    . cyanocobalamin 100 MCG tablet Take 100 mcg by mouth daily.    . fexofenadine (ALLEGRA ALLERGY) 180 MG tablet Take 180 mg by mouth daily.      [name] ibuprofen (ADVIL,MOTRIN) 200 MG tablet Take 200 mg by mouth every 6 (six) hours as needed.      . norethindrone-ethinyl estradiol (JUNEL FE,GILDESS FE,LOESTRIN FE) 1-20 MG-MCG tablet Take 1 tablet by mouth daily.    . Prenatal Vit-Fe Fumarate-FA (PRENATAL MULTIVITAMIN) TABS tablet Take 1 tablet by mouth daily at 12 noon.    . SUMAtriptan (IMITREX) 100 MG tablet Take 1 tablet (100 mg total) by mouth once as needed. 10 tablet 2   No facility-administered medications prior to visit.       Past Medical History:  Diagnosis Date  . ALLERGIC RHINITIS   . ALLERGY, INGESTED FOOD, DERMATITIS   . ASTHMA   . Irritable bowel syndrome   . MIGRAINE HEADACHE   . PCOS (polycystic ovarian syndrome)      Past Surgical History:  Procedure Laterality Date  . GYNECOLOGIC CRYOSURGERY       Family History  Problem Relation Age of Onset  . Arthritis Mother   . Hyperlipidemia Father   . Hypertension Father   . Arthritis Maternal Grandmother   . Cancer Maternal Grandmother   . Arthritis Paternal Grandmother   . Heart disease Paternal Grandmother   . Liver disease Paternal Grandfather      Social History   Social History  . Marital status: Single    Spouse name: N/A  . Number of children: 0  . Years of education: 58   Occupational History  . Marketing    Social History Main Topics  . Smoking status: Never Smoker  . Smokeless tobacco: Never Used     Comment: Single-has boyfriend. Employed Architectural technologist at [employer]  . Alcohol use Yes     Comment: Occ.  . Drug use: No  . Sexual activity: Yes  Birth control/ protection: Pill   Other Topics Concern  . Not on file   Social History Narrative   Denies abuse and feels safe at home.      Review of Systems  Constitutional: Denies fever, chills, fatigue, or significant weight gain/loss. HENT: Head: Denies headache or neck pain Ears: Denies changes in hearing, ringing in ears, earache, drainage Nose: Denies discharge, stuffiness, itching, nosebleed, sinus pain Throat: Denies sore throat, hoarseness, dry mouth, sores, thrush Eyes: Denies loss/changes in vision, pain, redness, blurry/double vision, flashing lights Cardiovascular: Denies chest pain/discomfort, tightness, palpitations, shortness of breath with activity, difficulty lying down, swelling, sudden awakening with shortness of breath Respiratory: Denies shortness of breath, cough, sputum production, wheezing Gastrointestinal: Denies  dysphasia, heartburn, change in appetite, nausea, change in bowel habits, rectal bleeding, constipation, diarrhea, yellow skin or eyes Genitourinary: Denies frequency, urgency, burning/pain, blood in urine, incontinence, change in urinary strength. Musculoskeletal: Denies muscle/joint pain, stiffness, back pain, redness or swelling of joints, trauma Skin: Denies rashes, lumps, itching, dryness, color changes, or hair/nail changes Neurological: Denies dizziness, fainting, seizures, weakness, numbness, tingling, tremor Psychiatric - Denies nervousness, stress, depression or memory loss Endocrine: Denies heat or cold intolerance, sweating, frequent urination, excessive thirst, changes in appetite Hematologic: Denies ease of bruising or bleeding     Objective:     BP 120/80 (BP Location: Left Arm, Patient Position: Sitting, Cuff Size: Normal)   Pulse 78   Temp 97.9 F (36.6 C) (Oral)   Resp 16   Ht 5' 4.5" (1.638 m)   Wt 147 lb 1.6 oz (66.7 kg)   SpO2 98%   BMI 24.86 kg/m  Nursing note and vital signs reviewed.  Physical Exam  Constitutional: She is oriented to person, place, and time. She appears well-developed and well-nourished.  HENT:  Head: Normocephalic.  Right Ear: Hearing, tympanic membrane, external ear and ear canal normal.  Left Ear: Hearing, tympanic membrane, external ear and ear canal normal.  Nose: Nose normal.  Mouth/Throat: Uvula is midline, oropharynx is clear and moist and mucous membranes are normal.  Eyes: Conjunctivae and EOM are normal. Pupils are equal, round, and reactive to light.  Neck: Neck supple. No JVD present. No tracheal deviation present. No thyromegaly present.  Cardiovascular: Normal rate, regular rhythm, normal heart sounds and intact distal pulses.   Pulmonary/Chest: Effort normal and breath sounds normal.  Abdominal: Soft. Bowel sounds are normal. She exhibits no distension and no mass. There is no tenderness. There is no rebound and no  guarding.  Musculoskeletal: Normal range of motion. She exhibits no edema or tenderness.  Lymphadenopathy:    She has no cervical adenopathy.  Neurological: She is alert and oriented to person, place, and time. She has normal reflexes. No cranial nerve deficit. She exhibits normal muscle tone. Coordination normal.  Skin: Skin is warm and dry.  Psychiatric: She has a normal mood and affect. Her behavior is normal. Judgment and thought content normal.       Assessment & Plan:   Problem List Items Addressed This Visit      Other   Routine general medical examination at a health care facility - Primary    1) Anticipatory Guidance: Discussed importance of wearing a seatbelt while driving and not texting while driving; changing batteries in smoke detector at least once annually; wearing suntan lotion when outside; eating a balanced and moderate diet; getting physical activity at least 30 minutes per day.  2) Immunizations / Screenings / Labs:  Declines influenza. All other  immunizations are up-to-date per recommendations. Obtain vitamin D for vitamin D deficiency screening. All screenings are up-to-date per recommendations. Obtain CBC, CMET, and lipid profile.    Overall well exam with minimal risk factors for cardiovascular disease that she is of good weight and exercises regularly. She appears to have a well balanced nutritional intake. Asthma is well controlled with no exacerbations. Continue healthy lifestyle behaviors and choices. Follow-up prevention exam in 1 year. Follow-up office visit pending blood work if indicated.      Relevant Orders   CBC (Completed)   Comprehensive metabolic panel (Completed)   Lipid panel (Completed)   VITAMIN D 25 Hydroxy (Vit-D Deficiency, Fractures) (Completed)    Other Visit Diagnoses   None.      I am having Ms. Trampe maintain her ibuprofen, fexofenadine, albuterol, cyanocobalamin, prenatal multivitamin, vitamin C, COD LIVER OIL PO, Biotin,  norethindrone-ethinyl estradiol, and SUMAtriptan.   Follow-up: Return in about 1 year (around 09/02/2017), or if symptoms worsen or fail to improve.   Jeanine Luzalone, Gregory, FNP

## 2016-09-02 NOTE — Assessment & Plan Note (Signed)
1) Anticipatory Guidance: Discussed importance of wearing a seatbelt while driving and not texting while driving; changing batteries in smoke detector at least once annually; wearing suntan lotion when outside; eating a balanced and moderate diet; getting physical activity at least 30 minutes per day.  2) Immunizations / Screenings / Labs:  Declines influenza. All other immunizations are up-to-date per recommendations. Obtain vitamin D for vitamin D deficiency screening. All screenings are up-to-date per recommendations. Obtain CBC, CMET, and lipid profile.    Overall well exam with minimal risk factors for cardiovascular disease that she is of good weight and exercises regularly. She appears to have a well balanced nutritional intake. Asthma is well controlled with no exacerbations. Continue healthy lifestyle behaviors and choices. Follow-up prevention exam in 1 year. Follow-up office visit pending blood work if indicated.

## 2016-09-24 ENCOUNTER — Other Ambulatory Visit: Payer: Self-pay

## 2016-09-24 DIAGNOSIS — G43809 Other migraine, not intractable, without status migrainosus: Secondary | ICD-10-CM

## 2016-09-24 MED ORDER — SUMATRIPTAN SUCCINATE 100 MG PO TABS: 100.0000 mg | ORAL_TABLET | Freq: Once | ORAL | 2 refills | Status: DC | PRN

## 2016-12-23 ENCOUNTER — Encounter: Payer: Self-pay | Admitting: Internal Medicine

## 2016-12-23 ENCOUNTER — Ambulatory Visit (INDEPENDENT_AMBULATORY_CARE_PROVIDER_SITE_OTHER): Payer: 59 | Admitting: Internal Medicine

## 2016-12-23 ENCOUNTER — Telehealth: Payer: Self-pay | Admitting: Family

## 2016-12-23 VITALS — BP 130/86 | HR 104 | Temp 98.4°F | Resp 16 | Wt 143.0 lb

## 2016-12-23 DIAGNOSIS — R6889 Other general symptoms and signs: Secondary | ICD-10-CM | POA: Diagnosis not present

## 2016-12-23 LAB — POCT INFLUENZA A/B
INFLUENZA A, POC: NEGATIVE
Influenza B, POC: NEGATIVE

## 2016-12-23 NOTE — Patient Instructions (Signed)
  If your symptoms worsen or fail to improve, please contact our office for further instruction, or in case of emergency go directly to the emergency room at the closest medical facility.   General Recommendations:    Please drink plenty of fluids.  Get plenty of rest   Sleep in humidified air  Use saline nasal sprays  Netti pot  OTC Medications:  Decongestants - helps relieve congestion   Flonase (generic fluticasone) or Nasacort (generic triamcinolone) - please make sure to use the "cross-over" technique at a 45 degree angle towards the opposite eye as opposed to straight up the nasal passageway.   Sudafed (generic pseudoephedrine - Note this is the one that is available behind the pharmacy counter); Products with phenylephrine (-PE) may also be used but is often not as effective as pseudoephedrine.   If you have HIGH BLOOD PRESSURE - Coricidin HBP; AVOID any product that is -D as this contains pseudoephedrine which may increase your blood pressure.  Afrin (oxymetazoline) every 6-8 hours for up to 3 days.  Allergies - helps relieve runny nose, itchy eyes and sneezing   Claritin (generic loratidine), Allegra (fexofenidine), or Zyrtec (generic cyrterizine) for runny nose. These medications should not cause drowsiness.  Note - Benadryl (generic diphenhydramine) may be used however may cause drowsiness  Cough -   Delsym or Robitussin (generic dextromethorphan)  Expectorants - helps loosen mucus to ease removal   Mucinex (generic guaifenesin) as directed on the package.  Headaches / General Aches   Tylenol (generic acetaminophen) - DO NOT EXCEED 3 grams (3,000 mg) in a 24 hour time period  Advil/Motrin (generic ibuprofen)  Sore Throat -   Salt water gargle   Chloraseptic (generic benzocaine) spray or lozenges / Sucrets (generic dyclonine)  

## 2016-12-23 NOTE — Telephone Encounter (Signed)
Got patient scheduled

## 2016-12-23 NOTE — Telephone Encounter (Signed)
[facility]esch[facility] pt.  States she has been [facility].  [facility]

## 2016-12-23 NOTE — Telephone Encounter (Signed)
Yes that is fine

## 2016-12-23 NOTE — Assessment & Plan Note (Signed)
Rapid flu test negative Symptoms are consistent with a viral infection Discussed symptomatic treatment at length No need for an antibiotic Advised her to call if there is no improvement or any worsening in her symptoms

## 2016-12-23 NOTE — Progress Notes (Signed)
Pre visit review using our clinic review tool, if applicable. No additional management support is needed unless otherwise documented below in the visit note. 

## 2016-12-23 NOTE — Progress Notes (Signed)
Subjective:    Patient ID: Christine MajorsAshley Cushman, female    DOB: 11-Nov-1980, 36 y.o.   MRN: [id number]  HPI She is here for an acute visit for cold symptoms.   Her symptoms started 5 days ago.    She is experiencing Fatigue, low grade fever 99.9, right-sided ear pain, runny nose, mild sinus pressure, sore throat, mild intermittent cough, some body aches and headaches. She denies any change in her appetite, sinus pain, shortness of breath, wheeze and gastrointestinal symptoms. She has tried taking nyquil, alka seltzer with some improvement in symptoms.  There have been many people that have been sick at work. This is her third illness over the winter and she is tired of being sick.  Medications and allergies reviewed with patient and updated if appropriate.  Patient Active Problem List   Diagnosis Date Noted  . Sinusitis, acute 10/11/2014  . Routine general medical examination at a health care facility 08/28/2014  . Knee pain, bilateral 08/28/2014  . Bug bite 08/28/2014  . Acute sinus infection 09/06/2013  . Eustachian tube dysfunction 09/06/2013  . Migraine headache 11/24/2010  . ALLERGIC RHINITIS 11/24/2010  . ASTHMA 11/24/2010  . Irritable bowel syndrome 11/24/2010  . ALLERGY, INGESTED FOOD, DERMATITIS 11/24/2010    Current Outpatient Prescriptions on File Prior to Visit  Medication Sig Dispense Refill  . albuterol (VENTOLIN HFA) 108 (90 BASE) MCG/ACT inhaler Inhale 2 puffs into the lungs every 4 (four) hours as needed for wheezing or shortness of breath. 18 g 3  . Ascorbic Acid (VITAMIN C) 100 MG tablet Take 100 mg by mouth daily.    . Biotin 1 MG CAPS Take by mouth.    . COD LIVER OIL PO Take by mouth.    . cyanocobalamin 100 MCG tablet Take 100 mcg by mouth daily.    . fexofenadine (ALLEGRA ALLERGY) 180 MG tablet Take 180 mg by mouth daily.      [name]. ibuprofen (ADVIL,MOTRIN) 200 MG tablet Take 200 mg by mouth every 6 (six) hours as needed.      . norethindrone-ethinyl estradiol  (JUNEL FE,GILDESS FE,LOESTRIN FE) 1-20 MG-MCG tablet Take 1 tablet by mouth daily.    . Prenatal Vit-Fe Fumarate-FA (PRENATAL MULTIVITAMIN) TABS tablet Take 1 tablet by mouth daily at 12 noon.    . SUMAtriptan (IMITREX) 100 MG tablet Take 1 tablet (100 mg total) by mouth once as needed. 10 tablet 2   No current facility-administered medications on file prior to visit.     Past Medical History:  Diagnosis Date  . ALLERGIC RHINITIS   . ALLERGY, INGESTED FOOD, DERMATITIS   . ASTHMA   . Irritable bowel syndrome   . MIGRAINE HEADACHE   . PCOS (polycystic ovarian syndrome)     Past Surgical History:  Procedure Laterality Date  . GYNECOLOGIC CRYOSURGERY      Social History   Social History  . Marital status: Single    Spouse name: N/A  . Number of children: 0  . Years of education: 7218   Occupational History  . Marketing    Social History Main Topics  . Smoking status: Never Smoker  . Smokeless tobacco: Never Used     Comment: Single-has boyfriend. Employed Architectural technologistmarketing officer at [employer]  . Alcohol use Yes     Comment: Occ.  . Drug use: No  . Sexual activity: Yes    Birth control/ protection: Pill   Other Topics Concern  . None   Social History Narrative  Denies abuse and feels safe at home.    Family History  Problem Relation Age of Onset  . Arthritis Mother   . Hyperlipidemia Father   . Hypertension Father   . Arthritis Maternal Grandmother   . Cancer Maternal Grandmother   . Arthritis Paternal Grandmother   . Heart disease Paternal Grandmother   . Liver disease Paternal Grandfather     Review of Systems  Constitutional: Positive for fatigue and fever. Negative for appetite change.  HENT: Positive for ear pain (right side only), rhinorrhea, sinus pressure and sore throat. Negative for postnasal drip and sinus pain.   Respiratory: Positive for cough (mild, dry cough). Negative for shortness of breath and wheezing.   Gastrointestinal: Negative for  abdominal pain, diarrhea and nausea.  Musculoskeletal: Positive for myalgias.  Neurological: Positive for headaches.       Objective:   Vitals:   12/23/16 1602  BP: 130/86  Pulse: (!) 104  Resp: 16  Temp: 98.4 F (36.9 C)   Filed Weights   12/23/16 1602  Weight: 143 lb (64.9 kg)   Body mass index is 24.17 kg/m.  Wt Readings from Last 3 Encounters:  12/23/16 143 lb (64.9 kg)  09/02/16 147 lb 1.6 oz (66.7 kg)  08/30/15 143 lb 6.4 oz (65 kg)     Physical Exam GENERAL APPEARANCE: Appears stated age, well appearing, NAD EYES: conjunctiva clear, no icterus HEENT: bilateral tympanic membranes and ear canals normal, oropharynx with mild erythema, no thyromegaly, trachea midline, no cervical or supraclavicular lymphadenopathy LUNGS: Clear to auscultation without wheeze or crackles, unlabored breathing, good air entry bilaterally HEART: Normal S1,S2 without murmurs EXTREMITIES: Without clubbing, cyanosis, or edema        Assessment & Plan:   See Problem List for Assessment and Plan of chronic medical problems.

## 2017-01-15 ENCOUNTER — Encounter: Payer: Self-pay | Admitting: Allergy & Immunology

## 2017-01-15 ENCOUNTER — Ambulatory Visit (INDEPENDENT_AMBULATORY_CARE_PROVIDER_SITE_OTHER): Payer: 59 | Admitting: Allergy & Immunology

## 2017-01-15 VITALS — BP 110/60 | HR 128 | Temp 98.1°F | Resp 16 | Ht 66.5 in | Wt 145.6 lb

## 2017-01-15 DIAGNOSIS — J310 Chronic rhinitis: Secondary | ICD-10-CM

## 2017-01-15 DIAGNOSIS — R000 Tachycardia, unspecified: Secondary | ICD-10-CM | POA: Diagnosis not present

## 2017-01-15 DIAGNOSIS — L249 Irritant contact dermatitis, unspecified cause: Secondary | ICD-10-CM | POA: Diagnosis not present

## 2017-01-15 DIAGNOSIS — J4520 Mild intermittent asthma, uncomplicated: Secondary | ICD-10-CM

## 2017-01-15 DIAGNOSIS — T781XXD Other adverse food reactions, not elsewhere classified, subsequent encounter: Secondary | ICD-10-CM

## 2017-01-15 MED ORDER — AZELASTINE HCL 0.1 % NA SOLN: 2.0000 | Freq: Every day | NASAL | 5 refills | Status: DC

## 2017-01-15 NOTE — Addendum Note (Signed)
Addended by: Virl SonGAINEY, Faizon Capozzi D on: 01/15/2017 01:54 PM   Modules accepted: Orders

## 2017-01-15 NOTE — Patient Instructions (Addendum)
1. Mild intermittent asthma, uncomplicated - Spirometry looked great today. - We will not make any changes today. - Continue with ProAir four puffs every 4-6 hours as needed  2. Irritant contact dermatitis - You would benefit from patch testing, which will test for 36 different chemicals.  - We will place the patches today.   - Come back on Monday to the [place] office for a read (5:30 or 5:45). - If the office is not unlocked, call me at [phone]. - You will need to come back on Wednesday for another read.   3. Chronic rhinitis - We can do testing at the next office visit. - Be sure to stay off of antihistamines for three days before.  - Try to get off of the Claritin D.  - Try using Xyzal 5mg  daily to see if this can help. - Start Dymista two sprays per nostril daily.  - Once we do testing, we can discuss allergy shots.   4. 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  5. Return in about 8 weeks (around 03/12/2017).  Please inform us of any Emergency Department visits, hospitalizations, or changes in symptoms. Call us before going to the ED for breathing or allergy symptoms since we might be able to fit you in for a sick visit. Feel free to contact us anytime with any questions, problems, or concerns.  It was a pleasure to meet you today! Happy spring!   Websites that have reliable patient information: 1. American Academy of Asthma, Allergy, and Immunology: www.aaaai.org 2. Food Allergy Research and Education (FARE): foodallergy.org 3. Mothers of Asthmatics: http://www.asthmacommunitynetwork.org 4. American College of Allergy, Asthma, and Immunology: www.acaai.org

## 2017-01-15 NOTE — Progress Notes (Addendum)
NEW PATIENT  Date of Service/Encounter:  01/15/17  Referring provider: Jeanine Luzalone, Gregory, FNP   Assessment:   Mild intermittent asthma, uncomplicated  Irritant contact dermatitis  Chronic rhinitis  Adverse food reaction   Tachycardia   Asthma Reportables:  Severity: intermittent  Risk: low Control: well controlled   Plan/Recommendations:   1. Mild intermittent asthma, uncomplicated - Spirometry looked great today. - We will not make any changes today. - Continue with ProAir four puffs every 4-6 hours as needed  2. Irritant versus allergic contact dermatitis - I anticipate that her reactions are likely caused by an ingredient in cosmetics.  - Therefore we will place True Test patches today. - Although we would prefer placement on Monday and reads on Wednesday and Friday, Christine Morgan's work schedule does not allow this. - Therefore since she is off today anyway, we will place them today and have her back in on Monday for a 72hr read then a 96hr read on Tuesday with Dr. Lucie LeatherKozlow in his late clinic.  3. Chronic rhinitis - We can do testing at the next office visit. - Be sure to stay off of antihistamines for three days before.  - Try to get off of the Claritin D.  - Try using Xyzal 5mg  daily to see if this can help. - Dymista was not on her formulary. - Therefore we started Nasacort one spray per nostril 1-2 times daily in conjunction with Astelin two sprays per nostril 1-2 times daily. - Once we do skin testing, we can discuss allergy shots.   4. 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 Tachycardia - likely related to anxiety secondary to starting a new job - There were no signs of dehydration and her ROS was negative for any cardiac related symptoms. - She does have a monitor on her wrist that will continue to  monitor her heart rate. - Return precautions provided including when to seek emergency help. - I encouraged her to drink lots of water today. - She will follow up with her PCP regarding further management.  - Per the patient, she does have a history of low heart rates as well, therefore raising the suspicion of POTS.   6. Return in about 8 weeks (around 03/12/2017).   Subjective:   Theresia Majorsshley Morgan is a 36 y.o. female presenting today for evaluation of  Chief Complaint  Patient presents with  . Rash    pt states that hair products cause rash on upper part of body.  . Food Intolerance    dairy intolerance    Christine Morgan has a history of the following: Patient Active Problem List   Diagnosis Date Noted  . Flu-like symptoms 12/23/2016  . Sinusitis, acute 10/11/2014  . Routine general medical examination at a health care facility 08/28/2014  . Knee pain, bilateral 08/28/2014  . Bug bite 08/28/2014  . Acute sinus infection 09/06/2013  . Eustachian tube dysfunction 09/06/2013  . Migraine headache 11/24/2010  . ALLERGIC RHINITIS 11/24/2010  . ASTHMA 11/24/2010  . Irritable bowel syndrome 11/24/2010  . ALLERGY, INGESTED FOOD, DERMATITIS 11/24/2010    History obtained from: chart review and patient.  Christine Morgan was referred by Christine Luzalone, Gregory, FNP.     Christine Morgan is a 36 y.o. female presenting for evaluation of a rash as well as management of  her allergic rhinitis and asthma.   Rash: Christine Morgan reports that when she washes her hair, she develops breaking out on her skin. It becomes rather pruritic and it lasts a matter of days. Then it will be clear up and does leave a semi permanent markings. This rash is more papular. She has had this rash for around two years but it has become worse in the last six months. She has tried changing products on several occasions without improvement. She is now using Bella Curls. She has used hydrocortisone for skin clearance, but nothing else aside  from this. She also has problems with make up. She does not wear make up every day. She is unable to narrow down what exactly is bothering her.         Asthma/Respiratory Symptom History: She was diagnosed when she was a child. Currently she has a rescue inhaler. She was on Advair Diskus at some point but has not taken that in quite some time. Triggers include allergies as well as viral URIs. The last time that she had a flare since moving to [facility] area was around 1-2 years ago. She was not treated with prednisone, instead trying to treat it at home. She does not have nocturnal symptoms. But she does have some daytime symptoms as well. She has had no hospitalizations, ICU admission, or intubations.   Allergic Rhinitis Symptom History: She does have a history of year round rhinitis symptoms. She was allergic to multiple allergens but never started allergy shots with [facility]. She is not using any nasal regimen at this time. She does have a Netti pot which she uses occasionally and has used other forms of nasal saline rinses. She also gets sinus infections, but often does not use antibiotics for treatment. She did have Flonase but it dried her out and she did not like it. There was a second spray that did tasted bad and she stopped using it. In any case she did not notice an improvement. She uses Claritin D on a daily basis.   Food Allergy Symptom History: She does have increased mucous production from milk in conjunction with stomach pain. She does note less symptoms when it is baked. She avoids milk and ice cream but she does eat cheese and sour cream. She was allergy tested around five years ago in [place]. She also reacts to oranges in which she breaks out in hives with throat itching and lip swelling. She has had testing to apples, wheat, peanuts, and tomatoes but this was negative on allergy skin testing.   Christine Morgan does have a history of bradycardia as well as low blood pressures  with dizziness. Her primary care physician follows this closely, and she has never seen cardiology. She denies ever having had a EKG. She does have tachycardia today which is quite noticeable, and she denies dehydration today. She does drink quite a bit of water. She is starting her first job today, therefore this might be related to some anxiety from this. She heart rate is usually low, although this was most notable when she was working out extensively. She denies dizziness today as well as headaches and vision changes.  Otherwise, there is no history of other atopic diseases, including drug allergies, stinging insect allergies, or urticaria. There is no significant infectious history. Vaccinations are up to date.    Past Medical History: Patient Active Problem List   Diagnosis Date Noted  . Flu-like symptoms 12/23/2016  . Sinusitis, acute 10/11/2014  .  Routine general medical examination at a health care facility 08/28/2014  . Knee pain, bilateral 08/28/2014  . Bug bite 08/28/2014  . Acute sinus infection 09/06/2013  . Eustachian tube dysfunction 09/06/2013  . Migraine headache 11/24/2010  . ALLERGIC RHINITIS 11/24/2010  . ASTHMA 11/24/2010  . Irritable bowel syndrome 11/24/2010  . ALLERGY, INGESTED FOOD, DERMATITIS 11/24/2010    Medication List:  Allergies as of 01/15/2017      Reactions   Penicillins       Medication List       Accurate as of 01/15/17  9:18 AM. Always use your most recent med list.          albuterol 108 (90 Base) MCG/ACT inhaler Commonly known as:  VENTOLIN HFA Inhale 2 puffs into the lungs every 4 (four) hours as needed for wheezing or shortness of breath.   ALLEGRA ALLERGY 180 MG tablet Generic drug:  fexofenadine Take 180 mg by mouth daily.   Biotin 1 MG Caps Take by mouth.   COD LIVER OIL PO Take by mouth.   cyanocobalamin 100 MCG tablet Take 100 mcg by mouth daily.   ibuprofen 200 MG tablet Commonly known as:  ADVIL,MOTRIN Take 200 mg by  mouth every 6 (six) hours as needed.   loratadine-pseudoephedrine 10-240 MG 24 hr tablet Commonly known as:  CLARITIN-D 24-hour Take 1 tablet by mouth daily.   norethindrone-ethinyl estradiol 1-20 MG-MCG tablet Commonly known as:  JUNEL FE,GILDESS FE,LOESTRIN FE Take 1 tablet by mouth daily.   prenatal multivitamin Tabs tablet Take 1 tablet by mouth daily at 12 noon.   SUMAtriptan 100 MG tablet Commonly known as:  IMITREX Take 1 tablet (100 mg total) by mouth once as needed.   vitamin C 100 MG tablet Take 100 mg by mouth daily.       Birth History: non-contributory.   Developmental History: non-contributory  Past Surgical History: Past Surgical History:  Procedure Laterality Date  . GYNECOLOGIC CRYOSURGERY       Family History: Family History  Problem Relation Age of Onset  . Arthritis Mother   . Eczema Mother   . Hyperlipidemia Father   . Hypertension Father   . Arthritis Maternal Grandmother   . Cancer Maternal Grandmother   . Allergic rhinitis Maternal Grandmother   . Asthma Maternal Grandmother   . Arthritis Paternal Grandmother   . Heart disease Paternal Grandmother   . Liver disease Paternal Grandfather   . Eczema Sister   . Asthma Sister   . Angioedema Neg Hx   . Immunodeficiency Neg Hx   . Urticaria Neg Hx      Social History: Christine Morgan lives at home with her mother and sister. Christine Morgan lives in an [place]. There are wood laminate floors in the main living areas and carpeting in the bedrooms. She has a feather mattress pad and has pruritis with it. She does have dust mite covers on her bedding but not her pillows. There are no pets and no smoking. She works for [employer]. She went to college in [place] from 2002-2008. She has a degree in business. She went to graduate school and has a degree in business as well.     Review of Systems: a 14-point review of systems is pertinent for what is mentioned in HPI.  Otherwise, all other systems were  negative. Constitutional: negative other than that listed in the HPI Eyes: negative other than that listed in the HPI Ears, nose, mouth, throat, and face: negative other than that  listed in the HPI Respiratory: negative other than that listed in the HPI Cardiovascular: negative other than that listed in the HPI Gastrointestinal: negative other than that listed in the HPI Genitourinary: negative other than that listed in the HPI Integument: negative other than that listed in the HPI Hematologic: negative other than that listed in the HPI Musculoskeletal: negative other than that listed in the HPI Neurological: negative other than that listed in the HPI Allergy/Immunologic: negative other than that listed in the HPI    Objective:   Blood pressure 110/60, pulse (!) 128, temperature 98.1 F (36.7 C), temperature source Oral, resp. rate 16, height 5' 6.5" (1.689 m), weight 145 lb 9.6 oz (66 kg). Body mass index is 23.15 kg/m.   Physical Exam:  General: Alert, interactive, in no acute distress. Cooperative with the exam. Pleasant and interactive. Thin.  Eyes: No conjunctival injection present on the right, No conjunctival injection present on the left, PERRL bilaterally, No discharge on the right, No discharge on the left and No Horner-Trantas dots present Ears: Right TM pearly gray with normal light reflex, Left TM pearly gray with normal light reflex and Left TM intact without perforation.  Nose/Throat: External nose within normal limits and septum midline, turbinates edematous and pale with clear discharge, post-pharynx erythematous with cobblestoning in the posterior oropharynx. Tonsils 2+ without exudates Neck: Supple without thyromegaly.  Adenopathy: no enlarged lymph nodes appreciated in the anterior cervical, occipital, axillary, epitrochlear, inguinal, or popliteal regions Lungs: Clear to auscultation without wheezing, rhonchi or rales. No increased work of breathing. CV: Normal  S1/S2, no murmurs. Capillary refill <2 seconds.  Abdomen: Nondistended, nontender. No guarding or rebound tenderness. Bowel sounds present in all fields and hyperactive  Skin: Dry, erythematous, excoriated papules with surrounding somewhat confluent erythema on the bilateral arms and shoulders. Extremities:  No clubbing, cyanosis or edema. Neuro:   Grossly intact. No focal deficits appreciated. Responsive to questions.  Diagnostic studies:  Spirometry: results normal (FEV1: 2.75/93%, FVC: 3.14/89%, FEV1/FVC: 88%).    Spirometry consistent with normal pattern.   Allergy Studies: deferred since patient took antihistamines today. However, True Test patches were placed on the back.     Malachi Bonds, MD FAAAAI [facility]

## 2017-01-18 ENCOUNTER — Encounter: Payer: 59 | Admitting: Allergy & Immunology

## 2017-01-18 ENCOUNTER — Telehealth: Payer: Self-pay | Admitting: Allergy and Immunology

## 2017-01-18 NOTE — Telephone Encounter (Signed)
due to incliment weather - appt was cancelled Patient will take patch off herself and take a picture of the area Patient is keeping that appt for 03/13/208

## 2017-01-19 ENCOUNTER — Encounter: Payer: 59 | Admitting: Allergy and Immunology

## 2017-01-19 ENCOUNTER — Encounter: Payer: 59 | Admitting: Pediatrics

## 2017-01-22 ENCOUNTER — Ambulatory Visit: Payer: 59 | Admitting: Allergy & Immunology

## 2017-01-22 DIAGNOSIS — L235 Allergic contact dermatitis due to other chemical products: Secondary | ICD-10-CM

## 2017-01-22 NOTE — Progress Notes (Signed)
    Follow-up Note  RE: Christine Majorsshley Bourbeau MRN: [id number] DOB: Mar 20, 1981 Date of Office Visit: 01/22/2017  Primary care provider: Jeanine Luzalone, Gregory, FNP Referring provider: Veryl Speakalone, Gregory D, FNP   Morrie Sheldonshley returns to the office today for the final patch test interpretation, given suspected history of contact dermatitis. Her patch readings have been altered secondary to adverse winter weather. She did remove the patches herself at hour 72 when her appointment was canceled due to snow. It is difficult to see from the pictures, but it seems that #1 (nickel)  had a an extremely positive reaction with questionable reactions to #15 (carba mix) and #33 (bacitracin). She does have clinical reactions to triple antibiotic ointment, therefore the #33 is likely real.   She did come in on Tuesday March 13th for an evaluation by Dr. Beaulah DinningBardelas. He has told me that #4 (potassium dichromate) was positive at 96 hours.   Diagnostics:  TRUE TEST 168 hour reading (delayed due to weather) and difficulty in placing the templates since the markers were not redrawn on Tuesday: very questionable reactions to #4 (potassium dichromate), #7 (colophony), #16 (black rubber mix), and #29 (imidazolidinyl urea)  Assessment and Plan:   1. Informational sheet provided on all of the above chemicals.  2. I did emphasize that all of these results should be taken in clinical perspective. 3. If she reads through the list of possible sources and she has no exposures, then they are likely not clinically useful. 4. #4 (potassium dichromate) was positive on two checks, therefore this one is likely the most clinically relevant. 5. #33 (bacitracin) does cause clinical reactions, therefore this one is clinically relevant as well.   Malachi BondsJoel Roen Macgowan, MD FAAAAI [facility]

## 2017-07-09 ENCOUNTER — Encounter: Payer: Self-pay | Admitting: Allergy & Immunology

## 2017-07-09 ENCOUNTER — Ambulatory Visit (INDEPENDENT_AMBULATORY_CARE_PROVIDER_SITE_OTHER): Payer: BLUE CROSS/BLUE SHIELD | Admitting: Allergy & Immunology

## 2017-07-09 VITALS — BP 118/72 | HR 88 | Temp 98.7°F | Resp 16 | Ht 66.5 in | Wt 155.0 lb

## 2017-07-09 DIAGNOSIS — J4520 Mild intermittent asthma, uncomplicated: Secondary | ICD-10-CM

## 2017-07-09 DIAGNOSIS — J302 Other seasonal allergic rhinitis: Secondary | ICD-10-CM

## 2017-07-09 DIAGNOSIS — J3089 Other allergic rhinitis: Secondary | ICD-10-CM

## 2017-07-09 DIAGNOSIS — L235 Allergic contact dermatitis due to other chemical products: Secondary | ICD-10-CM | POA: Diagnosis not present

## 2017-07-09 DIAGNOSIS — T781XXD Other adverse food reactions, not elsewhere classified, subsequent encounter: Secondary | ICD-10-CM

## 2017-07-09 MED ORDER — LEVOCETIRIZINE DIHYDROCHLORIDE 5 MG PO TABS: 5.0000 mg | ORAL_TABLET | Freq: Every evening | ORAL | 5 refills | Status: DC

## 2017-07-09 MED ORDER — TRIAMCINOLONE ACETONIDE 55 MCG/ACT NA AERO: INHALATION_SPRAY | NASAL | 5 refills | Status: DC

## 2017-07-09 NOTE — Progress Notes (Signed)
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 Seasonal and perennial allergic rhinitis - Testing today showed: trees, weeds, grasses, dust mites, dog and cockroach - Avoidance measures provided. - Continue with Xyzal (levocetirizine) 5mg  tablet once daily, Nasacort (triamcinolone) one spray per nostril daily and Astelin (azelastine) 2 sprays per nostril 1-2 times daily as needed  - You can use an extra dose of the antihistamine, if needed, for breakthrough symptoms.  - Consider nasal saline rinses 1-2 times daily to remove allergens from the nasal cavities as well as help with mucous clearance (this is especially helpful to do before the nasal sprays are given) - Consider allergy shots as a means of long-term control. - Allergy shots "re-train" and "reset" the immune system to ignore environmental allergens and decrease the resulting immune response to those allergens (sneezing, itchy watery eyes, runny nose, nasal congestion, etc).    - Allergy shots improve symptoms in 75-85% of patients.  - Consider starting allergy shots.  2. Adverse food reaction (peanuts, fish, shellfish) - Try getting these out of your diet to see if it helps your skin. - If it helps after a 2-4 weeks, keep it out of your diet. - There is no need for an EpiPen at this time since you were not having reactions consistent with anaphylaxis.  3. Return in about 6 months (around 01/06/2018).  Subjective:   Christine Morgan is a 36 y.o. female presenting today for follow up of  Chief Complaint  Patient presents with    . Allergy Testing    [name] has a history of the following: Patient Active Problem List   Diagnosis Date Noted  . Flu-like symptoms 12/23/2016  . Sinusitis, acute 10/11/2014  . Routine general medical examination at a health care facility 08/28/2014  . Knee pain, bilateral 08/28/2014  . Bug bite 08/28/2014  . Acute sinus infection 09/06/2013  . Eustachian tube dysfunction 09/06/2013  . Migraine headache 11/24/2010  . ALLERGIC RHINITIS 11/24/2010  . ASTHMA 11/24/2010  . Irritable bowel syndrome 11/24/2010  . ALLERGY, INGESTED FOOD, DERMATITIS 11/24/2010    History obtained from: chart review and patient.  Christine [facility] Morgan's Primary Care Provider is Christine Morgan, Christine D, FNP.     Christine Morgan is a 36 y.o. female presenting for a skin testing. She was first seen in March 2018 for her initial evaluation. Her main complaint at that time was a rash on her upper body with hair products. In talking to her, however, it became clear that her atopic history was much more complicated than that. She has a history of asthma, which was controlled with albuterol as needed. She also endorsed allergic rhinitis symptoms throughout the year, and she reported some reactions to foods, including increased mucous production with dairy as well as stomach pain with dairy. She also reported hives and lip swelling with oranges. She did undergo patch testing, however the reads were slightly messed up due to adverse weather. Patch testing was strongly positive to nickel as well as potassium dichromate, with questionable reactions to colophony, black rubber mix, and imidazolidinyl urea.   Since the last visit, she has done well. She has continued to look through all  of her cosmetics to see if there are any of her known triggers, however she has yet to find an adequate shampoo and conditioner replacement. She has seen some improvement over time. She continues to have problems with her rhinitis, and she remains interested  in allergen immunotherapy. She had wanted to pursue it when she was being followed [facility], but distance was an issue. Asthma has been under good control, and she is does not remember the last time that she needed her albuterol inhaler.   Otherwise, there have been no changes to her past medical history, surgical history, family history, or social history.    Review of Systems: a 14-point review of systems is pertinent for what is mentioned in HPI.  Otherwise, all other systems were negative. Constitutional: negative other than that listed in the HPI Eyes: negative other than that listed in the HPI Ears, nose, mouth, throat, and face: negative other than that listed in the HPI Respiratory: negative other than that listed in the HPI Cardiovascular: negative other than that listed in the HPI Gastrointestinal: negative other than that listed in the HPI Genitourinary: negative other than that listed in the HPI Integument: negative other than that listed in the HPI Hematologic: negative other than that listed in the HPI Musculoskeletal: negative other than that listed in the HPI Neurological: negative other than that listed in the HPI Allergy/Immunologic: negative other than that listed in the HPI    Objective:   Blood pressure 118/72, pulse 88, temperature 98.7 F (37.1 C), temperature source Oral, resp. rate 16, height 5' 6.5" (1.689 m), weight 155 lb (70.3 kg), SpO2 98 %. Body mass index is 24.65 kg/m.   Physical Exam:  General: Alert, interactive, in no acute distress. Pleasant as always.  Eyes: No conjunctival injection present on the right, No conjunctival injection present on the left, PERRL bilaterally, No discharge on the right and No discharge on the left Ears: Right TM pearly gray with normal light reflex, Left TM pearly gray with normal light reflex, Right TM intact without perforation and Left TM intact without perforation.  Nose/Throat: External nose within normal  limits and septum midline, turbinates markedly edematous with clear discharge, post-pharynx mildly erythematous without cobblestoning in the posterior oropharynx. Tonsils 2+ without exudates Neck: Supple without thyromegaly. Lungs: Clear to auscultation without wheezing, rhonchi or rales. No increased work of breathing. CV: Normal S1/S2, no murmurs. Capillary refill <2 seconds.  Skin: Warm and dry, without lesions or rashes. Neuro:   Grossly intact. No focal deficits appreciated. Responsive to questions.   Diagnostic studies:   Spirometry: Normal FEV1, FVC, and FEV1/FVC ratio. There is no scooping suggestive of obstructive disease.    Allergy Studies:   Indoor/Outdoor Percutaneous Adult Environmental Panel: positive to bahia grass, [place] grass, johnson grass, [place] blue grass, meadow fescue grass, perennial rye grass, sweet vernal grass, timothy grass, English plantain, ash, birch, American beech, Box elder, red cedar, [address], hickory, maple, oak, pecan pollen, black walnut pollen, Df mite, Dp mites, dog and cockroach. Otherwise negative with adequate controls.  Most Common Foods Panel (peanut, cashew, soy, fish mix, shellfish mix, wheat, milk, egg): equivocal reactions to peanut, fish mix, and shellfish mix. Otherwise negative with adequate controls.   Selected Foods Panel: negative to orange and yeast with adequate controls     Malachi Bonds, MD FAAAAI [facility]

## 2017-07-09 NOTE — Patient Instructions (Addendum)
1. Seasonal and perennial allergic rhinitis - Testing today showed: trees, weeds, grasses, dust mites, dog and cockroach - Avoidance measures provided. - Continue with Xyzal (levocetirizine) 5mg  tablet once daily, Nasacort (triamcinolone) one spray per nostril daily and Astelin (azelastine) 2 sprays per nostril 1-2 times daily as needed  - You can use an extra dose of the antihistamine, if needed, for breakthrough symptoms.  - Consider nasal saline rinses 1-2 times daily to remove allergens from the nasal cavities as well as help with mucous clearance (this is especially helpful to do before the nasal sprays are given) - Consider allergy shots as a means of long-term control. - Allergy shots "re-train" and "reset" the immune system to ignore environmental allergens and decrease the resulting immune response to those allergens (sneezing, itchy watery eyes, runny nose, nasal congestion, etc).    - Allergy shots improve symptoms in 75-85% of patients.  - Consider starting allergy shots.  2. Adverse food reaction (peanuts, fish, shellfish) - Try getting these out of your diet to see if it helps your skin. - If it helps after a 2-4 weeks, keep it out of your diet. - There is no need for an EpiPen at this time since you were not having reactions consistent with anaphylaxis.  3. Return in about 6 months (around 01/06/2018).   Please inform us of any Emergency Department visits, hospitalizations, or changes in symptoms. Call us before going to the ED for breathing or allergy symptoms since we might be able to fit you in for a sick visit. Feel free to contact us anytime with any questions, problems, or concerns.  It was a pleasure to see you again today! Enjoy the rest of your summer!   Websites that have reliable patient information: 1. American Academy of Asthma, Allergy, and Immunology: www.aaaai.org 2. Food Allergy Research and Education (FARE): foodallergy.org 3. Mothers of Asthmatics:  http://www.asthmacommunitynetwork.org 4. American College of Allergy, Asthma, and Immunology: www.acaai.org   Election Day is coming up on Tuesday, November 6th! Make your voice heard! Register to vote at [email or web address]!     Reducing Pollen Exposure  The American Academy of Allergy, Asthma and Immunology suggests the following steps to reduce your exposure to pollen during allergy seasons.    1. Do not hang sheets or clothing out to dry; pollen may collect on these items. 2. Do not mow lawns or spend time around freshly cut grass; mowing stirs up pollen. 3. Keep windows closed at night.  Keep car windows closed while driving. 4. Minimize morning activities outdoors, a time when pollen counts are usually at their highest. 5. Stay indoors as much as possible when pollen counts or humidity is high and on windy days when pollen tends to remain in the air longer. 6. Use air conditioning when possible.  Many air conditioners have filters that trap the pollen spores. 7. Use a HEPA room air filter to remove pollen form the indoor air you breathe.  Control of Dog or Cat Allergen  Avoidance is the best way to manage a dog or cat allergy. If you have a dog or cat and are allergic to dog or cats, consider removing the dog or cat from the home. If you have a dog or cat but don't want to find it a new home, or if your family wants a pet even though someone in the household is allergic, here are some strategies that may help keep symptoms at bay:  1. Keep the pet out of your bedroom  and restrict it to only a few rooms. Be advised that keeping the dog or cat in only one room will not limit the allergens to that room. 2. Don't pet, hug or kiss the dog or cat; if you do, wash your hands with soap and water. 3. High-efficiency particulate air (HEPA) cleaners run continuously in a bedroom or living room can reduce allergen levels over time. 4. Regular use of a high-efficiency vacuum cleaner or a central vacuum can  reduce allergen levels. 5. Giving your dog or cat a bath at least once a week can reduce airborne allergen.  Control of Cockroach Allergen  Cockroach allergen has been identified as an important cause of acute attacks of asthma, especially in urban settings.  There are fifty-five species of cockroach that exist in the [place], however only three, the [place], [place] and Oriental species produce allergen that can affect patients with Asthma.  Allergens can be obtained from fecal particles, egg casings and secretions from cockroaches.    1. Remove food sources. 2. Reduce access to water. 3. Seal access and entry points. 4. Spray runways with 0.5-1% Diazinon or Chlorpyrifos 5. Blow boric acid power under stoves and refrigerator. 6. Place bait stations (hydramethylnon) at feeding sites.  Control of House Dust Mite Allergen    House dust mites play a major role in allergic asthma and rhinitis.  They occur in environments with high humidity wherever human skin, the food for dust mites is found. High levels have been detected in dust obtained from mattresses, pillows, carpets, upholstered furniture, bed covers, clothes and soft toys.  The principal allergen of the house dust mite is found in its feces.  A gram of dust may contain 1,000 mites and 250,000 fecal particles.  Mite antigen is easily measured in the air during house cleaning activities.    1. Encase mattresses, including the box spring, and pillow, in an air tight cover.  Seal the zipper end of the encased mattresses with wide adhesive tape. 2. Wash the bedding in water of 130 degrees Farenheit weekly.  Avoid cotton comforters/quilts and flannel bedding: the most ideal bed covering is the dacron comforter. 3. Remove all upholstered furniture from the bedroom. 4. Remove carpets, carpet padding, rugs, and non-washable window drapes from the bedroom.  Wash drapes weekly or use plastic window coverings. 5. Remove all non-washable  stuffed toys from the bedroom.  Wash stuffed toys weekly. 6. Have the room cleaned frequently with a vacuum cleaner and a damp dust-mop.  The patient should not be in a room which is being cleaned and should wait 1 hour after cleaning before going into the room. 7. Close and seal all heating outlets in the bedroom.  Otherwise, the room will become filled with dust-laden air.  An electric heater can be used to heat the room. 8. Reduce indoor humidity to less than 50%.  Do not use a humidifier.

## 2017-07-16 ENCOUNTER — Encounter: Payer: Self-pay | Admitting: Allergy & Immunology

## 2017-07-19 ENCOUNTER — Telehealth: Payer: Self-pay

## 2017-07-19 NOTE — Telephone Encounter (Signed)
I have a prescription for my inhaler but it wasn't called into the pharmacy. I'll need it sent to the pharmacy in order to take it to my dental appointment on Thursday. [facility] [address] [place].    Thank you,    I am not sure of what inhaler she is referring too, as there is no documentation of an inhaler in her chart. I called pts [phone] number and never received an answer. Will try again later

## 2017-07-20 ENCOUNTER — Other Ambulatory Visit: Payer: Self-pay

## 2017-07-20 MED ORDER — LEVOCETIRIZINE DIHYDROCHLORIDE 5 MG PO TABS: 5.0000 mg | ORAL_TABLET | Freq: Every evening | ORAL | 2 refills | Status: DC

## 2017-08-12 ENCOUNTER — Ambulatory Visit (INDEPENDENT_AMBULATORY_CARE_PROVIDER_SITE_OTHER): Payer: BLUE CROSS/BLUE SHIELD | Admitting: Family Medicine

## 2017-08-12 ENCOUNTER — Encounter: Payer: Self-pay | Admitting: Family Medicine

## 2017-08-12 VITALS — BP 120/80 | HR 93 | Temp 98.6°F | Ht 66.5 in | Wt 155.0 lb

## 2017-08-12 DIAGNOSIS — R200 Anesthesia of skin: Secondary | ICD-10-CM

## 2017-08-12 DIAGNOSIS — N898 Other specified noninflammatory disorders of vagina: Secondary | ICD-10-CM | POA: Diagnosis not present

## 2017-08-12 DIAGNOSIS — M357 Hypermobility syndrome: Secondary | ICD-10-CM | POA: Insufficient documentation

## 2017-08-12 NOTE — Assessment & Plan Note (Signed)
Counseled on the different aspects of this syndrome. Advised extending her joints past their maximal ability.  - if her symptoms are not controlled could consider adding a low dose nortriptyline

## 2017-08-12 NOTE — Patient Instructions (Signed)
Thank you for coming in,   Please focus on your range of motion.   Please let me know if your numbness gets worse or changes.    Please feel free to call with any questions or concerns at any time, at [phone]. --Dr. Jordan Likes

## 2017-08-12 NOTE — Progress Notes (Signed)
[place] - 36 y.o. female MRN [id number]  Date of birth: [birth year]  SUBJECTIVE:  Including CC & ROS.  Chief Complaint  Patient presents with  . Back Pain    Pain started about a month or so ago after working out. Pain only happens when she moves her leg or neck a certain way. Patient notices tingling in her leg after running. Patient notices numbness in her legs after morving her body in a certain way.     Ms. Abt is a 36 year old female that is presenting with numbness in the lateral aspect of her lower leg and foot. This is been an ongoing for a month and a half. She notices these symptoms when she is a mild into a run or turns her head a certain way. She does not have these symptoms when she is doing exercises or weight training. She denies any pain associated with it. She has not tried any medications. She has seen a chiropractor but that does not improve her symptoms. She denies any initial insult. She denies any history of any surgeries on her back.     Review of Systems  Musculoskeletal: Negative for back pain and gait problem.  Skin: Negative for color change.  Neurological: Positive for numbness. Negative for weakness.    HISTORY: Past Medical, Surgical, Social, and Family History Reviewed & Updated per EMR.   Pertinent Historical Findings include:  Past Medical History:  Diagnosis Date  . ALLERGIC RHINITIS   . ALLERGY, INGESTED FOOD, DERMATITIS   . ASTHMA   . Asthma   . Irritable bowel syndrome   . MIGRAINE HEADACHE   . PCOS (polycystic ovarian syndrome)     Past Surgical History:  Procedure Laterality Date  . GYNECOLOGIC CRYOSURGERY      Allergies  Allergen Reactions  . Penicillins     Family History  Problem Relation Age of Onset  . Arthritis Mother   . Eczema Mother   . Hyperlipidemia Father   . Hypertension Father   . Arthritis Maternal Grandmother   . Cancer Maternal Grandmother   . Allergic rhinitis Maternal Grandmother   . Asthma Maternal  Grandmother   . Arthritis Paternal Grandmother   . Heart disease Paternal Grandmother   . Liver disease Paternal Grandfather   . Eczema Sister   . Asthma Sister   . Angioedema Neg Hx   . Immunodeficiency Neg Hx   . Urticaria Neg Hx      Social History   Social History  . Marital status: Single    Spouse name: N/A  . Number of children: 0  . Years of education: 53   Occupational History  . Marketing    Social History Main Topics  . Smoking status: Never Smoker  . Smokeless tobacco: Never Used     Comment: Single-has boyfriend. Employed Architectural technologist at [employer]  . Alcohol use Yes     Comment: Occ.  . Drug use: No  . Sexual activity: Yes    Birth control/ protection: Pill   Other Topics Concern  . Not on file   Social History Narrative   Denies abuse and feels safe at home.     PHYSICAL EXAM:  VS: BP 120/80 (BP Location: Left Arm, Patient Position: Sitting, Cuff Size: Normal)   Pulse 93   Temp 98.6 F (37 C) (Oral)   Ht 5' 6.5" (1.689 m)   Wt 155 lb (70.3 kg)   LMP 07/22/2017 (Exact Date)   SpO2 95%  BMI 24.64 kg/m  Physical Exam Gen: NAD, alert, cooperative with exam, well-appearing ENT: normal lips, normal nasal mucosa,  Eye: normal EOM, normal conjunctiva and lids CV:  no edema, +2 pedal pulses   Resp: no accessory muscle use, non-labored,   Skin: no rashes, no areas of induration  Neuro: normal tone, normal sensation to touch Psych:  normal insight, alert and oriented MSK:  Back:  No tenderness to palpation over the lumbar spine, paraspinal muscle, SI joint, piriformis, or greater trochanter. Normal strength with resistance to hip flexion. Normal strength with the flexion and extension. This fits. Normal gait. Negative straight leg raise bilaterally. Neurovascular intact. Deep tendon reflexes are symmetric bilaterally  Able to touch her thumb piriformis bilaterally. Able to extend her pinky past 90 bilaterally. Able to take  her elbows and knees past 0. Able to place her hands final floor     ASSESSMENT & PLAN:   I spent 25 minutes with this patient, greater than 50% was face-to-face time counseling regarding the below diagnosis.   Leg numbness It appears that she may have some nerve irritation but no associated pain. This is likely associated with her underlying hypermobility with small subluxations within her back. - Counseled on the different strategies of treatment. Encourage regular strength training and yoga but would recommend to not go past normal range of motion and focus on good form. - If no improvement or worsening can consider referral to physical therapy or nerve conduction study. - could obtain xrays and/or MRI lumbar spine but these will likely be normal.    Hypermobility syndrome Counseled on the different aspects of this syndrome. Advised extending her joints past their maximal ability.  - if her symptoms are not controlled could consider adding a low dose nortriptyline

## 2017-08-12 NOTE — Assessment & Plan Note (Addendum)
It appears that she may have some nerve irritation but no associated pain. This is likely associated with her underlying hypermobility with small subluxations within her back. - Counseled on the different strategies of treatment. Encourage regular strength training and yoga but would recommend to not go past normal range of motion and focus on good form. - If no improvement or worsening can consider referral to physical therapy or nerve conduction study. - could obtain xrays and/or MRI lumbar spine but these will likely be normal.

## 2017-09-09 ENCOUNTER — Encounter: Payer: Self-pay | Admitting: Family Medicine

## 2017-09-09 ENCOUNTER — Ambulatory Visit (INDEPENDENT_AMBULATORY_CARE_PROVIDER_SITE_OTHER): Payer: BLUE CROSS/BLUE SHIELD | Admitting: Family Medicine

## 2017-09-09 VITALS — BP 120/76 | HR 95 | Temp 98.4°F | Ht 64.0 in | Wt 156.1 lb

## 2017-09-09 DIAGNOSIS — J4520 Mild intermittent asthma, uncomplicated: Secondary | ICD-10-CM

## 2017-09-09 DIAGNOSIS — Z0000 Encounter for general adult medical examination without abnormal findings: Secondary | ICD-10-CM

## 2017-09-09 DIAGNOSIS — E559 Vitamin D deficiency, unspecified: Secondary | ICD-10-CM | POA: Diagnosis not present

## 2017-09-09 LAB — COMPREHENSIVE METABOLIC PANEL
ALK PHOS: 90 U/L (ref 39–117)
ALT: 16 U/L (ref 0–35)
AST: 16 U/L (ref 0–37)
Albumin: 4.1 g/dL (ref 3.5–5.2)
BUN: 8 mg/dL (ref 6–23)
CHLORIDE: 106 meq/L (ref 96–112)
CO2: 28 meq/L (ref 19–32)
CREATININE: 0.89 mg/dL (ref 0.40–1.20)
Calcium: 9.3 mg/dL (ref 8.4–10.5)
GFR: 93.3 mL/min (ref >60.00)
Glucose, Bld: 83 mg/dL (ref 70–99)
POTASSIUM: 4.2 meq/L (ref 3.5–5.1)
SODIUM: 139 meq/L (ref 135–145)
Total Bilirubin: 0.5 mg/dL (ref 0.2–1.2)
Total Protein: 7.5 g/dL (ref 6.0–8.3)

## 2017-09-09 LAB — LIPID PANEL
CHOLESTEROL: 142 mg/dL (ref 0–200)
HDL: 54 mg/dL (ref >39.00)
LDL Cholesterol: 81 mg/dL (ref 0–99)
NonHDL: 88.04
TRIGLYCERIDES: 37 mg/dL (ref 0.0–149.0)
Total CHOL/HDL Ratio: 3
VLDL: 7.4 mg/dL (ref 0.0–40.0)

## 2017-09-09 LAB — CBC
HCT: 40.9 % (ref 36.0–46.0)
HEMOGLOBIN: 13.7 g/dL (ref 12.0–15.0)
MCHC: 33.4 g/dL (ref 30.0–36.0)
MCV: 94.4 fl (ref 78.0–100.0)
PLATELETS: 276 10*3/uL (ref 150.0–400.0)
RBC: 4.33 Mil/uL (ref 3.87–5.11)
RDW: 13.3 % (ref 11.5–15.5)
WBC: 5.4 10*3/uL (ref 4.0–10.5)

## 2017-09-09 LAB — VITAMIN D 25 HYDROXY (VIT D DEFICIENCY, FRACTURES): VITD: 16.67 ng/mL — AB (ref 30.00–100.00)

## 2017-09-09 MED ORDER — ALBUTEROL SULFATE HFA 108 (90 BASE) MCG/ACT IN AERS: 2.0000 | INHALATION_SPRAY | RESPIRATORY_TRACT | 3 refills | Status: DC | PRN

## 2017-09-09 NOTE — Patient Instructions (Addendum)
Send me a MyChart message in 2 weeks about hypomobility syndrome.   Think about the pneumonia shot.   Let us know if you need anything.

## 2017-09-09 NOTE — Progress Notes (Signed)
Pre visit review using our clinic review tool, if applicable. No additional management support is needed unless otherwise documented below in the visit note. 

## 2017-09-09 NOTE — Progress Notes (Signed)
Chief Complaint  Patient presents with  . Establish Care    needs cpe     [name] is here for a complete physical.   Her last physical was >1 year ago.  Current diet: in general, a "healthy" diet. Current exercise: strength training. Weight is stable and she denies daytime fatigue. No LMP recorded.  Seatbelt? Yes    Health Maintenance Pap/HPV- Yes Tetanus- Yes HIV- Yes ,11/17  Past Medical History:  Diagnosis Date  . ALLERGIC RHINITIS   . ALLERGY, INGESTED FOOD, DERMATITIS   . ASTHMA   . Asthma   . Irritable bowel syndrome   . MIGRAINE HEADACHE   . PCOS (polycystic ovarian syndrome)     Past Surgical History:  Procedure Laterality Date  . GYNECOLOGIC CRYOSURGERY     Medications  Current Outpatient Prescriptions on File Prior to Visit  Medication Sig Dispense Refill  . albuterol (VENTOLIN HFA) 108 (90 BASE) MCG/ACT inhaler Inhale 2 puffs into the lungs every 4 (four) hours as needed for wheezing or shortness of breath. 18 g 3  . Ascorbic Acid (VITAMIN C) 100 MG tablet Take 100 mg by mouth daily.    [name] azelastine (ASTELIN) 0.1 % nasal spray Place 2 sprays into both nostrils daily. Use in each nostril as directed 30 mL 5  . Biotin 1 MG CAPS Take by mouth.    [name] [name] 0.35 MG tablet TK 1 T PO D  6  . COD LIVER OIL PO Take by mouth.    . cyanocobalamin 100 MCG tablet Take 100 mcg by mouth daily.    [name] ibuprofen (ADVIL,MOTRIN) 200 MG tablet Take 200 mg by mouth every 6 (six) hours as needed.      [name] levocetirizine (XYZAL) 5 MG tablet Take 1 tablet (5 mg total) by mouth every evening. 90 tablet 2  . Prenatal Vit-Fe Fumarate-FA (PRENATAL MULTIVITAMIN) TABS tablet Take 1 tablet by mouth daily at 12 noon.    . SUMAtriptan (IMITREX) 100 MG tablet Take 1 tablet (100 mg total) by mouth once as needed. 10 tablet 2  . triamcinolone (NASACORT) 55 MCG/ACT AERO nasal inhaler One spray each nostril once a day for nasal congestion or drainage. 16.9 mL 5    Allergies Allergies  Allergen Reactions  . Penicillins     Review of Systems: Constitutional:  no unexpected change in weight, no weakness, no unexplained fevers, sweats, or chills Eye:  no recent significant change in vision Ear/Nose/Mouth/Throat:  Ears:  no tinnitus or vertigo and no recent change in hearing, Nose/Mouth/Throat:  no complaints of nasal congestion or discharge, no sore throat and no recent change in voice or hoarseness Cardiovascular:  no exercise intolerance, no chest pain, no palpitations Respiratory:  no chronic cough, sputum, or hemoptysis and no shortness of breath Gastrointestinal:  no abdominal pain, no change in bowel habits, no significant change in appetite, no nausea, vomiting, diarrhea, or constipation and no black or bloody stool GU:  Female: negative for dysuria, frequency, and incontinence, Normal menses; no abnormal bleeding, pelvic pain, or discharge Musculoskeletal/Extremities:  no pain, redness, or swelling of the joints Integumentary (Skin/Breast):  no abnormal skin lesions reported, no new breast lumps or masses Neurologic:  no chronic headaches, no numbness, tingling, or tremor Psychiatric:  no anxiety, no depression Endocrine:  denies fatigue, weight changes, heat/cold intolerance, bowel or skin changes, or cardiovascular system symptoms Hematologic/Lymphatic:  no abnormal bleeding, no HIV risk factors, no night sweats, no swollen nodes, no weight loss Allergic/Immunologic:  no history of food or environmental allergies  Exam BP 120/76 (BP Location: Left Arm, Patient Position: Sitting, Cuff Size: Normal)   Pulse 95   Temp 98.4 F (36.9 C) (Oral)   Ht 5\' 4"  (1.626 m)   Wt 156 lb 2 oz (70.8 kg)   SpO2 98%   BMI 26.80 kg/m  General:  well developed, well nourished, in no apparent distress Skin:  no significant moles, warts, or growths Head:  no masses, lesions, or tenderness Eyes:  pupils equal and round, sclera anicteric without  injection Ears:  canals without lesions, TMs shiny without retraction, no obvious effusion, no erythema Nose:  nares patent, septum midline, mucosa normal, and no drainage or sinus tenderness Throat/Pharynx:  lips and gingiva without lesion; tongue and uvula midline; non-inflamed pharynx; no exudates or postnasal drainage Neck: neck supple without adenopathy, thyromegaly, or masses Breasts:  Deferred Thorax:  nontender Lungs:  clear to auscultation, breath sounds equal bilaterally, no respiratory distress Cardio:  regular rate and rhythm without murmurs, heart sounds without clicks or rubs, point of maximal impulse normal; no lifts, heaves, or thrills Abdomen:  abdomen soft, nontender; bowel sounds normal; no masses or organomegaly Genital: Female: Defer to GYN Musculoskeletal:  symmetrical muscle groups noted without atrophy or deformity Extremities:  no clubbing, cyanosis, or edema, no deformities, no skin discoloration Neuro:  gait normal; deep tendon reflexes normal and symmetric Psych: well oriented with normal range of affect and appropriate judgment/insight  Assessment and Plan  Well adult exam - Plan: CBC, Comprehensive metabolic panel, Lipid panel  Mild intermittent asthma without complication - Plan: albuterol (VENTOLIN HFA) 108 (90 Base) MCG/ACT inhaler  Vitamin D insufficiency - Plan: Vitamin D (25 hydroxy)   Well 36 y.o. female. Counseled on diet and exercise. Get back in gym and clean up diet.  Other orders as above. Hypermobility EDS? She asked my opinion and I don't have a great answer today. She is to send a MyChart message in 2 weeks after I research more. She is not having any issues today, just curious what my opinion is. Follow up 1 yr or prn. The patient voiced understanding and agreement to the plan.  Jilda Rocheicholas Paul [place], DO 09/09/17 10:03 AM

## 2017-09-10 ENCOUNTER — Other Ambulatory Visit: Payer: Self-pay | Admitting: Family

## 2017-09-10 DIAGNOSIS — E559 Vitamin D deficiency, unspecified: Secondary | ICD-10-CM

## 2017-09-21 ENCOUNTER — Encounter: Payer: Self-pay | Admitting: Family Medicine

## 2017-09-21 ENCOUNTER — Ambulatory Visit: Payer: BLUE CROSS/BLUE SHIELD | Admitting: Family Medicine

## 2017-09-21 VITALS — BP 118/80 | HR 90 | Temp 98.2°F | Ht 64.5 in | Wt 157.2 lb

## 2017-09-21 DIAGNOSIS — J029 Acute pharyngitis, unspecified: Secondary | ICD-10-CM | POA: Diagnosis not present

## 2017-09-21 DIAGNOSIS — R52 Pain, unspecified: Secondary | ICD-10-CM | POA: Diagnosis not present

## 2017-09-21 LAB — POCT RAPID STREP A (OFFICE): RAPID STREP A SCREEN: NEGATIVE

## 2017-09-21 LAB — POCT INFLUENZA A/B
Influenza A, POC: NEGATIVE
Influenza B, POC: NEGATIVE

## 2017-09-21 NOTE — Patient Instructions (Signed)
Strep Throat Strep throat is a bacterial infection of the throat. Your health care provider may call the infection tonsillitis or pharyngitis, depending on whether there is swelling in the tonsils or at the back of the throat. Strep throat is most common during the cold months of the year in children who are 5-34 years of age, but it can happen during any season in people of any age. This infection is spread from person to person (contagious) through coughing, sneezing, or close contact. What are the causes? Strep throat is caused by the bacteria called Streptococcus pyogenes. What increases the risk? This condition is more likely to develop in:  People who spend time in crowded places where the infection can spread easily.  People who have close contact with someone who has strep throat.  What are the signs or symptoms? Symptoms of this condition include:  Fever or chills.  Redness, swelling, or pain in the tonsils or throat.  Pain or difficulty when swallowing.  White or yellow spots on the tonsils or throat.  Swollen, tender glands in the neck or under the jaw.  Red rash all over the body (rare).  How is this diagnosed? This condition is diagnosed by performing a rapid strep test or by taking a swab of your throat (throat culture test). Results from a rapid strep test are usually ready in a few minutes, but throat culture test results are available after one or two days. How is this treated? This condition is treated with antibiotic medicine. Follow these instructions at home: Medicines  Take over-the-counter and prescription medicines only as told by your health care provider.  Take your antibiotic as told by your health care provider. Do not stop taking the antibiotic even if you start to feel better.  Have family members who also have a sore throat or fever tested for strep throat. They may need antibiotics if they have the strep infection. Eating and drinking  Do not  share food, drinking cups, or personal items that could cause the infection to spread to other people.  If swallowing is difficult, try eating soft foods until your sore throat feels better.  Drink enough fluid to keep your urine clear or pale yellow. General instructions  Gargle with a salt-water mixture 3-4 times per day or as needed. To make a salt-water mixture, completely dissolve -1 tsp of salt in 1 cup of warm water.  Make sure that all household members wash their hands well.  Get plenty of rest.  Stay home from school or work until you have been taking antibiotics for 24 hours.  Keep all follow-up visits as told by your health care provider. This is important. Contact a health care provider if:  The glands in your neck continue to get bigger.  You develop a rash, cough, or earache.  You cough up a thick liquid that is green, yellow-brown, or bloody.  You have pain or discomfort that does not get better with medicine.  Your problems seem to be getting worse rather than better.  You have a fever. Get help right away if:  You have new symptoms, such as vomiting, severe headache, stiff or painful neck, chest pain, or shortness of breath.  You have severe throat pain, drooling, or changes in your voice.  You have swelling of the neck, or the skin on the neck becomes red and tender.  You have signs of dehydration, such as fatigue, dry mouth, and decreased urination.  You become increasingly sleepy, or   you cannot wake up completely.  Your joints become red or painful. This information is not intended to replace advice given to you by your health care provider. Make sure you discuss any questions you have with your health care provider. Document Released: 10/23/2000 Document Revised: 06/24/2016 Document Reviewed: 02/18/2015 Elsevier Interactive Patient Education  2017 [employer]  

## 2017-09-21 NOTE — Progress Notes (Signed)
Patient ID: Christine Morgan, female    DOB: February 13, 1981  Age: 36 y.o. MRN: [id number]    Subjective:  Subjective  HPI Christine Morgan presents for sore throat and body aches since last week.   By Friday she had fever > 101--  She was taking otc meds for symptoms.  Tylenol cold and flu, theraful day and night with little relief.  She saw virtual dr through work and he thought she may have strep and called in an abx ---doxycycline -- she started it Sunday.  No more fevers but neck is stiff.   Sore throat is gone.   No congestion.    Review of Systems  Constitutional: Positive for chills and fever. Negative for appetite change, diaphoresis, fatigue and unexpected weight change.  HENT: Positive for congestion, postnasal drip, rhinorrhea and sinus pressure.   Eyes: Negative for pain, redness and visual disturbance.  Respiratory: Negative for cough, chest tightness, shortness of breath and wheezing.   Cardiovascular: Negative for chest pain, palpitations and leg swelling.  Endocrine: Negative for cold intolerance, heat intolerance, polydipsia, polyphagia and polyuria.  Genitourinary: Negative for difficulty urinating, dysuria and frequency.  Allergic/Immunologic: Negative for environmental allergies.  Neurological: Positive for headaches. Negative for dizziness, light-headedness and numbness.    History Past Medical History:  Diagnosis Date  . ALLERGIC RHINITIS   . ALLERGY, INGESTED FOOD, DERMATITIS   . ASTHMA   . Asthma   . Irritable bowel syndrome   . MIGRAINE HEADACHE   . PCOS (polycystic ovarian syndrome)     She has a past surgical history that includes Gynecologic cryosurgery.   Her family history includes Allergic rhinitis in her maternal grandmother; Arthritis in her maternal grandmother, mother, and paternal grandmother; Asthma in her maternal grandmother and sister; Cancer in her maternal grandmother; Eczema in her mother and sister; Heart disease in her paternal grandmother;  Hyperlipidemia in her father; Hypertension in her father; Liver disease in her paternal grandfather.She reports that  has never smoked. she has never used smokeless tobacco. She reports that she drinks alcohol. She reports that she does not use drugs.  Current Outpatient Medications on File Prior to Visit  Medication Sig Dispense Refill  . albuterol (VENTOLIN HFA) 108 (90 Base) MCG/ACT inhaler Inhale 2 puffs into the lungs every 4 (four) hours as needed for wheezing or shortness of breath. 18 g 3  . Ascorbic Acid (VITAMIN C) 100 MG tablet Take 100 mg by mouth daily.    [name]. azelastine (ASTELIN) 0.1 % nasal spray Place 2 sprays into both nostrils daily. Use in each nostril as directed 30 mL 5  . Biotin 1 MG CAPS Take by mouth.    [name]. [name] 0.35 MG tablet TK 1 T PO D  6  . COD LIVER OIL PO Take by mouth.    . cyanocobalamin 100 MCG tablet Take 100 mcg by mouth daily.    [name]. ibuprofen (ADVIL,MOTRIN) 200 MG tablet Take 200 mg by mouth every 6 (six) hours as needed.      [name]. levocetirizine (XYZAL) 5 MG tablet Take 1 tablet (5 mg total) by mouth every evening. 90 tablet 2  . Prenatal Vit-Fe Fumarate-FA (PRENATAL MULTIVITAMIN) TABS tablet Take 1 tablet by mouth daily at 12 noon.    . SUMAtriptan (IMITREX) 100 MG tablet Take 1 tablet (100 mg total) by mouth once as needed. 10 tablet 2  . triamcinolone (NASACORT) 55 MCG/ACT AERO nasal inhaler One spray each nostril once a day for nasal congestion or drainage. 16.9  mL 5   No current facility-administered medications on file prior to visit.      Objective:  Objective  Physical Exam  Constitutional: She is oriented to person, place, and time. She appears well-developed and well-nourished.  HENT:  Right Ear: External ear normal.  Left Ear: External ear normal.  Mouth/Throat: Oropharyngeal exudate and posterior oropharyngeal erythema present.  + PND + errythema  Eyes: Conjunctivae are normal. Right eye exhibits no discharge. Left eye exhibits no discharge.    Cardiovascular: Normal rate, regular rhythm and normal heart sounds.  No murmur heard. Pulmonary/Chest: Effort normal and breath sounds normal. No respiratory distress. She has no wheezes. She has no rales. She exhibits no tenderness.  Musculoskeletal: She exhibits no edema.  Lymphadenopathy:    She has cervical adenopathy.  Neurological: She is alert and oriented to person, place, and time.  Nursing note and vitals reviewed.  BP 118/80   Pulse 90   Temp 98.2 F (36.8 C) (Oral)   Ht 5' 4.5" (1.638 m)   Wt 157 lb 3.2 oz (71.3 kg)   SpO2 99%   BMI 26.57 kg/m  Wt Readings from Last 3 Encounters:  09/21/17 157 lb 3.2 oz (71.3 kg)  09/09/17 156 lb 2 oz (70.8 kg)  08/12/17 155 lb (70.3 kg)     Lab Results  Component Value Date   WBC 5.4 09/09/2017   HGB 13.7 09/09/2017   HCT 40.9 09/09/2017   PLT 276.0 09/09/2017   GLUCOSE 83 09/09/2017   CHOL 142 09/09/2017   TRIG 37.0 09/09/2017   HDL 54.00 09/09/2017   LDLCALC 81 09/09/2017   ALT 16 09/09/2017   AST 16 09/09/2017   NA 139 09/09/2017   K 4.2 09/09/2017   CL 106 09/09/2017   CREATININE 0.89 09/09/2017   BUN 8 09/09/2017   CO2 28 09/09/2017   TSH 1.67 08/30/2015    Mr Brain Wo Contrast  Result Date: 11/09/2011 *RADIOLOGY REPORT* Clinical Data: Severe migraines since 1995.  Increasing headaches with nausea and visual changes accompanied by dizziness. MRI HEAD WITHOUT CONTRAST Technique:  Multiplanar, multiecho pulse sequences of the brain and surrounding structures were obtained according to standard protocol without intravenous contrast. Comparison: None. Findings: There is no evidence for acute infarction, intracranial hemorrhage, mass lesion, hydrocephalus, or extra-axial fluid. There is no evidence for cerebral or cerebellar atrophy. There are a few small foci of subcortical white matter signal abnormality in the right frontal lobe which are nonspecific but in the appropriate clinical setting could represent areas of  ischemic demyelination from complicated migraine (image 10-11 of series 10, images 15 - 17 of series 7). No left-sided abnormalities are seen, and there are no abnormalities in the brainstem or cerebellum. No areas of chronic hemorrhage are identified.  The major intracranial vascular structures are patent based on flow void appearance.  Pituitary, pineal, and cerebellar tonsils are normal. There is no upper cervical abnormality.  Visualized extracranial soft tissues are unremarkable. There is no acute sinus, mastoid, or orbital abnormality. IMPRESSION: Three or four subcentimeter foci of subcortical white matter signal abnormality in the right frontal lobe.  In the appropriate clinical setting these could represent sequelae of complicated migraine. Correlate clinically. Original Report Authenticated By: Elsie StainJOHN T. CURNES, M.D.    Assessment & Plan:  Plan  I am having Christine Morgan maintain her ibuprofen, cyanocobalamin, prenatal multivitamin, vitamin C, COD LIVER OIL PO, Biotin, SUMAtriptan, azelastine, [name], triamcinolone, levocetirizine, and albuterol.  No orders of the defined types were placed  in this encounter.   Problem List Items Addressed This Visit    None    Visit Diagnoses    Sore throat    -  Primary   Relevant Orders   POCT rapid strep A (Completed)   Culture, Group A Strep   Body aches       Relevant Orders   POCT Influenza A/B (Completed)    flu neg Finish doxy and rto prn  Follow-up: Return if symptoms worsen or fail to improve.  Donato Schultz, DO

## 2017-09-22 LAB — CULTURE, GROUP A STREP
MICRO NUMBER: 81277735
SPECIMEN QUALITY: ADEQUATE

## 2017-10-20 DIAGNOSIS — Z131 Encounter for screening for diabetes mellitus: Secondary | ICD-10-CM | POA: Diagnosis not present

## 2017-10-20 DIAGNOSIS — Z1151 Encounter for screening for human papillomavirus (HPV): Secondary | ICD-10-CM | POA: Diagnosis not present

## 2017-10-20 DIAGNOSIS — Z113 Encounter for screening for infections with a predominantly sexual mode of transmission: Secondary | ICD-10-CM | POA: Diagnosis not present

## 2017-10-20 DIAGNOSIS — Z6826 Body mass index (BMI) 26.0-26.9, adult: Secondary | ICD-10-CM | POA: Diagnosis not present

## 2017-10-20 DIAGNOSIS — Z118 Encounter for screening for other infectious and parasitic diseases: Secondary | ICD-10-CM | POA: Diagnosis not present

## 2017-10-20 DIAGNOSIS — R635 Abnormal weight gain: Secondary | ICD-10-CM | POA: Diagnosis not present

## 2017-10-20 DIAGNOSIS — Z01419 Encounter for gynecological examination (general) (routine) without abnormal findings: Secondary | ICD-10-CM | POA: Diagnosis not present

## 2017-11-30 ENCOUNTER — Encounter: Payer: Self-pay | Admitting: Family Medicine

## 2017-11-30 DIAGNOSIS — G43809 Other migraine, not intractable, without status migrainosus: Secondary | ICD-10-CM

## 2017-12-01 NOTE — Telephone Encounter (Signed)
Ok to reorder Imitrex. Schedule lab visit in early Feb for Vit D, lab order placed in Nov. I don't understand what she is asking about following up with me for hypermobility and that any info is appreciated?

## 2017-12-02 ENCOUNTER — Other Ambulatory Visit: Payer: Self-pay | Admitting: Family Medicine

## 2017-12-02 MED ORDER — SUMATRIPTAN SUCCINATE 100 MG PO TABS: 100.0000 mg | ORAL_TABLET | Freq: Once | ORAL | 2 refills | Status: DC | PRN

## 2017-12-02 NOTE — Addendum Note (Signed)
Addended by: Scharlene GlossEWING, [place] Skipper B on: 12/02/2017 07:44 AM   Modules accepted: Orders

## 2017-12-02 NOTE — Addendum Note (Signed)
Addended by: Scharlene GlossEWING, ROBIN B on: 12/02/2017 09:54 AM   Modules accepted: Orders

## 2017-12-17 ENCOUNTER — Other Ambulatory Visit: Payer: BLUE CROSS/BLUE SHIELD

## 2017-12-20 ENCOUNTER — Other Ambulatory Visit (INDEPENDENT_AMBULATORY_CARE_PROVIDER_SITE_OTHER): Payer: BLUE CROSS/BLUE SHIELD

## 2017-12-20 ENCOUNTER — Other Ambulatory Visit: Payer: Self-pay | Admitting: Family Medicine

## 2017-12-20 DIAGNOSIS — E559 Vitamin D deficiency, unspecified: Secondary | ICD-10-CM

## 2017-12-20 LAB — VITAMIN D 25 HYDROXY (VIT D DEFICIENCY, FRACTURES): VITD: 17.12 ng/mL — AB (ref 30.00–100.00)

## 2017-12-20 MED ORDER — VITAMIN D (ERGOCALCIFEROL) 1.25 MG (50000 UNIT) PO CAPS: 50000.0000 [IU] | ORAL_CAPSULE | ORAL | 0 refills | Status: DC

## 2017-12-27 ENCOUNTER — Telehealth: Payer: Self-pay | Admitting: Family Medicine

## 2017-12-27 DIAGNOSIS — G43809 Other migraine, not intractable, without status migrainosus: Secondary | ICD-10-CM

## 2017-12-27 MED ORDER — SUMATRIPTAN SUCCINATE 100 MG PO TABS: 100.0000 mg | ORAL_TABLET | Freq: Once | ORAL | 2 refills | Status: DC | PRN

## 2017-12-27 NOTE — Telephone Encounter (Signed)
Resent with info needed.

## 2017-12-27 NOTE — Telephone Encounter (Signed)
Copied from CRM [phone]. Topic: Quick Communication - Rx Refill/Question >> Dec 27, 2017 10:14 AM Stephannie LiSimmons, Jonthan Leite L, NT wrote: Medication: sumatriptan 100 mg  Has the patient contacted their pharmacy?  (Agent: If no, request that the patient contact the pharmacy for the refill.) Preferred Pharmacy (with phone number or street name): [facility] and said they need signature and directions are missing please call [phone] re # [id number] Agent: Please be advised that RX refills may take up to 3 business days. We ask that you follow-up with your pharmacy.

## 2018-01-13 ENCOUNTER — Telehealth: Payer: Self-pay

## 2018-01-13 ENCOUNTER — Encounter: Payer: Self-pay | Admitting: Allergy & Immunology

## 2018-01-13 NOTE — Telephone Encounter (Signed)
Reviewed note. Stop the Xyzal and start Allegra 180mg  once daily. She can pick up samples at the office if she would like.  Malachi BondsJoel Nevayah Faust, MD [facility] 

## 2018-01-13 NOTE — Telephone Encounter (Signed)
----   Message from Mychart, Generic sent at 01/13/2018 3:37 PM EST -----    Hello,    The current daily allergy medicine you proscribed makes me drowsy. I've tried taking it at night to minimize the affects. I wake up really groggy. Are there other options?    Thank you,    [facility]     Please advise

## 2018-01-14 ENCOUNTER — Other Ambulatory Visit: Payer: Self-pay

## 2018-01-14 MED ORDER — FEXOFENADINE HCL 180 MG PO TABS: 180.0000 mg | ORAL_TABLET | Freq: Every day | ORAL | 5 refills | Status: DC

## 2018-01-14 NOTE — Telephone Encounter (Signed)
Informed pt and sent in the rx for allegra

## 2018-03-08 ENCOUNTER — Other Ambulatory Visit: Payer: Self-pay | Admitting: Family Medicine

## 2018-03-09 ENCOUNTER — Telehealth: Payer: Self-pay | Admitting: Family Medicine

## 2018-03-09 NOTE — Telephone Encounter (Signed)
Check in 1.5 weeks please. Please order and schedule. TY.

## 2018-03-09 NOTE — Telephone Encounter (Signed)
Copied from CRM [id number]. Topic: Quick Communication - See Telephone Encounter >> Mar 09, 2018  4:14 PM Eston Mould B wrote: CRM for notification. See Telephone encounter for: 03/09/18.  PT is asking if she needs to continue Vitamin D  or does she need to come in to get levels checked again

## 2018-03-14 ENCOUNTER — Other Ambulatory Visit: Payer: Self-pay | Admitting: Family Medicine

## 2018-03-14 DIAGNOSIS — E559 Vitamin D deficiency, unspecified: Secondary | ICD-10-CM

## 2018-03-14 NOTE — Telephone Encounter (Signed)
Lab appt scheduled/order entered.

## 2018-03-21 ENCOUNTER — Other Ambulatory Visit (INDEPENDENT_AMBULATORY_CARE_PROVIDER_SITE_OTHER): Payer: BLUE CROSS/BLUE SHIELD

## 2018-03-21 DIAGNOSIS — E559 Vitamin D deficiency, unspecified: Secondary | ICD-10-CM

## 2018-03-21 LAB — VITAMIN D 25 HYDROXY (VIT D DEFICIENCY, FRACTURES): VITD: 46.18 ng/mL (ref 30.00–100.00)

## 2018-09-23 ENCOUNTER — Ambulatory Visit (INDEPENDENT_AMBULATORY_CARE_PROVIDER_SITE_OTHER): Payer: BLUE CROSS/BLUE SHIELD | Admitting: Family Medicine

## 2018-09-23 ENCOUNTER — Encounter: Payer: Self-pay | Admitting: Family Medicine

## 2018-09-23 VITALS — BP 118/76 | Temp 97.8°F | Ht 65.0 in | Wt 151.5 lb

## 2018-09-23 DIAGNOSIS — Z0000 Encounter for general adult medical examination without abnormal findings: Secondary | ICD-10-CM

## 2018-09-23 DIAGNOSIS — J4520 Mild intermittent asthma, uncomplicated: Secondary | ICD-10-CM

## 2018-09-23 DIAGNOSIS — E559 Vitamin D deficiency, unspecified: Secondary | ICD-10-CM

## 2018-09-23 MED ORDER — ALBUTEROL SULFATE HFA 108 (90 BASE) MCG/ACT IN AERS: 2.0000 | INHALATION_SPRAY | RESPIRATORY_TRACT | 3 refills | Status: DC | PRN

## 2018-09-23 NOTE — Progress Notes (Addendum)
Chief Complaint  Patient presents with  . Annual Exam     [name] is here for a complete physical.   Her last physical was >1 year ago.  Current diet: in general, a "not so good lately" diet. It is normally good. Current exercise: Cardio, lifting weights. No LMP recorded. Seatbelt? Yes  Health Maintenance Pap/HPV- Yes 10/09/2017 Tetanus- Yes HIV screening- Yes  Past Medical History:  Diagnosis Date  . ALLERGIC RHINITIS   . ALLERGY, INGESTED FOOD, DERMATITIS   . ASTHMA   . Asthma   . Irritable bowel syndrome   . MIGRAINE HEADACHE   . PCOS (polycystic ovarian syndrome)      Past Surgical History:  Procedure Laterality Date  . GYNECOLOGIC CRYOSURGERY      Medications  Current Outpatient Medications on File Prior to Visit  Medication Sig Dispense Refill  . albuterol (VENTOLIN HFA) 108 (90 Base) MCG/ACT inhaler Inhale 2 puffs into the lungs every 4 (four) hours as needed for wheezing or shortness of breath. 18 g 3  . Ascorbic Acid (VITAMIN C) 100 MG tablet Take 100 mg by mouth daily.    . Biotin 1 MG CAPS Take by mouth.    [name] [name] 0.35 MG tablet TK 1 T PO D  6  . COD LIVER OIL PO Take by mouth.    . cyanocobalamin 100 MCG tablet Take 100 mcg by mouth daily.    . fexofenadine (ALLEGRA) 180 MG tablet Take 1 tablet (180 mg total) by mouth daily. 30 tablet 5  . ibuprofen (ADVIL,MOTRIN) 200 MG tablet Take 200 mg by mouth every 6 (six) hours as needed.      . Prenatal Vit-Fe Fumarate-FA (PRENATAL MULTIVITAMIN) TABS tablet Take 1 tablet by mouth daily at 12 noon.    . SUMAtriptan (IMITREX) 100 MG tablet Take 1 tablet (100 mg total) by mouth once as needed. 10 tablet 2  . triamcinolone (NASACORT) 55 MCG/ACT AERO nasal inhaler One spray each nostril once a day for nasal congestion or drainage. 16.9 mL 5  . Vitamin D, Ergocalciferol, (DRISDOL) 50000 units CAPS capsule TAKE 1 CAPSULE BY MOUTH EVERY 7 DAYS 12 capsule 0   Allergies Allergies  Allergen Reactions  .  Penicillins Rash    Review of Systems: Constitutional:  no unexpected weight changes Eye:  no recent significant change in vision Ear/Nose/Mouth/Throat:  Ears:  no tinnitus or vertigo and no recent change in hearing Nose/Mouth/Throat:  no complaints of nasal congestion, no sore throat Cardiovascular: no chest pain Respiratory:  no cough and no shortness of breath Gastrointestinal:  no abdominal pain, no change in bowel habits GU:  Female: negative for dysuria or pelvic pain Musculoskeletal/Extremities:  no pain of the joints Integumentary (Skin/Breast):  no abnormal skin lesions reported Neurologic:  no headaches Endocrine:  denies fatigue Hematologic/Lymphatic:  No areas of easy bleeding  Exam BP 118/76 (BP Location: Left Arm, Patient Position: Sitting, Cuff Size: Normal)   Temp 97.8 F (36.6 C) (Oral)   Ht 5\' 5"  (1.651 m)   Wt 151 lb 8 oz (68.7 kg)   BMI 25.21 kg/m  General:  well developed, well nourished, in no apparent distress Skin:  no significant moles, warts, or growths Head:  no masses, lesions, or tenderness Eyes:  pupils equal and round, sclera anicteric without injection Ears:  canals without lesions, TMs shiny without retraction, no obvious effusion, no erythema Nose:  nares patent, septum midline, mucosa normal, and no drainage or sinus tenderness Throat/Pharynx:  lips and gingiva without lesion; tongue and uvula midline; non-inflamed pharynx; no exudates or postnasal drainage Neck: neck supple without adenopathy, thyromegaly, or masses Lungs:  clear to auscultation, breath sounds equal bilaterally, no respiratory distress Cardio:  regular rate and rhythm, no bruits, no LE edema Abdomen:  abdomen soft, nontender; bowel sounds normal; no masses or organomegaly Genital: Defer to GYN Musculoskeletal: Tight external rotators on R, neg straight leg but hamstrings are tight b/l; otherwise symmetrical muscle groups noted without atrophy or deformity Extremities:  no  clubbing, cyanosis, or edema, no deformities, no skin discoloration Neuro:  gait normal; deep tendon reflexes normal and symmetric Psych: well oriented with normal range of affect and appropriate judgment/insight   Thoracolumbar skin on R  Assessment and Plan  Well adult exam - Plan: CBC, Comprehensive metabolic panel, Lipid panel, CANCELED: Comprehensive metabolic panel, CANCELED: Lipid panel, CANCELED: CBC, CANCELED: Vitamin D (25 hydroxy)  Mild intermittent asthma without complication - Plan: albuterol (VENTOLIN HFA) 108 (90 Base) MCG/ACT inhaler  Vitamin D deficiency - Plan: Vitamin D (25 hydroxy)   Well 37 y.o. female. Counseled on diet and exercise. Tight thigh external rotators. Stretch these. If no improvement, will have her see Christine Morgan.  Other orders as above. Follow up in 1 yr or prn. The patient voiced understanding and agreement to the plan.  Christine Rocheicholas Paul [place], DO 12/01/18 5:18 PM

## 2018-09-23 NOTE — Progress Notes (Signed)
Pre visit review using our clinic review tool, if applicable. No additional management support is needed unless otherwise documented below in the visit note. 

## 2018-09-23 NOTE — Patient Instructions (Signed)
Give [place] 2-3 business days to get the results of your labs back.   Keep the diet clean and stay active.  Let us know if you need anything.  Send me a MyChart message in 1 mo if no better with your right leg. We will get you set up with the sports medicine team upstairs if that is the case.   Hip Exercises It is normal to feel mild stretching, pulling, tightness, or discomfort as you do these exercises, but you should stop right away if you feel sudden pain or your pain gets worse.  STRETCHING AND RANGE OF MOTION EXERCISES These exercises warm up your muscles and joints and improve the movement and flexibility of your hip. These exercises also help to relieve pain, numbness, and tingling. Exercise A: Hamstrings, Supine  1. Lie on your back. 2. Loop a belt or towel over the ball of your left / rightfoot. The ball of your foot is on the walking surface, right under your toes. 3. Straighten your left / rightknee and slowly pull on the belt to raise your leg. ? Do not let your left / right knee bend while you do this. ? Keep your other leg flat on the floor. ? Raise the left / right leg until you feel a gentle stretch behind your left / right knee or thigh. 4. Hold this position for 30 seconds. 5. Slowly return your leg to the starting position. Repeat2 times. Complete this stretch 3 times per week. Exercise B: Hip Rotators  1. Lie on your back on a firm surface. 2. Hold your left / right knee with your left / right hand. Hold your ankle with your other hand. 3. Gently pull your left / right knee and rotate your lower leg toward your other shoulder. ? Pull until you feel a stretch in your buttocks. ? Keep your hips and shoulders firmly planted while you do this stretch. 4. Hold this position for 30 seconds. Repeat 2 times. Complete this stretch 3 times per week. Exercise C: V-Sit (Hamstrings and Adductors)  1. Sit on the floor with your legs extended in a large "V" shape. Keep your  knees straight during this exercise. 2. Start with your head and chest upright, then bend at your waist to reach for your left foot (position A). You should feel a stretch in your right inner thigh. 3. Hold this position for 30 seconds. Then slowly return to the upright position. 4. Bend at your waist to reach forward (position B). You should feel a stretch behind both of your thighs and knees. 5. Hold this position for 30 seconds. Then slowly return to the upright position. 6. Bend at your waist to reach for your right foot (position C). You should feel a stretch in your left inner thigh. 7. Hold this position for 30 seconds. Then slowly return to the upright position. Repeat A, B, and C 2 times each. Complete this stretch 3 times per week. Exercise D: Lunge (Hip Flexors)  1. Place your left / right knee on the floor and bend your other knee so that is directly over your ankle. You should be half-kneeling. 2. Keep good posture with your head over your shoulders. 3. Tighten your buttocks to point your tailbone downward. This helps your back to keep from arching too much. 4. You should feel a gentle stretch in the front of your left / right thigh and hip. If you do not feel any resistance, slightly slide your other foot  forward and then slowly lunge forward so your knee once again lines up over your ankle. 5. Make sure your tailbone continues to point downward. 6. Hold this position for 30 seconds. Repeat 2 times. Complete this stretch 3 times per week.  STRENGTHENING EXERCISES These exercises build strength and endurance in your hip. Endurance is the ability to use your muscles for a long time, even after they get tired. Exercise E: Bridge (Hip Extensors)  1. Lie on your back on a firm surface with your knees bent and your feet flat on the floor. 2. Tighten your buttocks muscles and lift your bottom off the floor until the trunk of your body is level with your thighs. ? Do not arch your  back. ? You should feel the muscles working in your buttocks and the back of your thighs. If you do not feel these muscles, slide your feet 1-2 inches (2.5-5 cm) farther away from your buttocks. 3. Hold this position for 3 seconds. 4. Slowly lower your hips to the starting position. Repeat for a total of 10 repetitions. 5. Let your muscles relax completely between repetitions. 6. If this exercise is too easy, try doing it with your arms crossed over your chest. Repeat 2 times. Complete this exercise 3 times per week. Exercise F: Straight Leg Raises - Hip Abductors  1. Lie on your side with your left / right leg in the top position. Lie so your head, shoulder, knee, and hip line up with each other. You may bend your bottom knee to help you balance. 2. Roll your hips slightly forward, so your hips are stacked directly over each other and your left / right knee is facing forward. 3. Leading with your heel, lift your top leg 4-6 inches (10-15 cm). You should feel the muscles in your outer hip lifting. ? Do not let your foot drift forward. ? Do not let your knee roll toward the ceiling. 4. Hold this position for 1 second. 5. Slowly return to the starting position. 6. Let your muscles relax completely between repetitions. Repeat for a total of 10 repetitions.  Repeat 2 times. Complete this exercise 3 times per week. Exercise G: Straight Leg Raises - Hip Adductors  1. Lie on your side with your left / right leg in the bottom position. Lie so your head, shoulder, knee, and hip line up. You may place your upper foot in front to help you balance. 2. Roll your hips slightly forward, so your hips are stacked directly over each other and your left / right knee is facing forward. 3. Tense the muscles in your inner thigh and lift your bottom leg 4-6 inches (10-15 cm). 4. Hold this position for 1 second. 5. Slowly return to the starting position. 6. Let your muscles relax completely between repetitions.  Repeat for a total of 10 repetitions. Repeat 2 times. Complete this exercise 3 times per week. Exercise H: Straight Leg Raises - Quadriceps  1. Lie on your back with your left / right leg extended and your other knee bent. 2. Tense the muscles in the front of your left / right thigh. When you do this, you should see your kneecap slide up or see increased dimpling just above your knee. 3. Tighten these muscles even more and raise your leg 4-6 inches (10-15 cm) off the floor. 4. Hold this position for 3 seconds. 5. Keep these muscles tense as you lower your leg. 6. Relax the muscles slowly and completely between repetitions. Repeat  for a total of 10 repetitions. Repeat 2 times. Complete this exercise 3 times per week. Exercise I: Hip Abductors, Standing 1. Tie one end of a rubber exercise band or tubing to a secure surface, such as a table or pole. 2. Loop the other end of the band or tubing around your left / right ankle. 3. Keeping your ankle with the band or tubing directly opposite of the secured end, step away until there is tension in the tubing or band. Hold onto a chair as needed for balance. 4. Lift your left / right leg out to your side. While you do this: ? Keep your back upright. ? Keep your shoulders over your hips. ? Keep your toes pointing forward. ? Make sure to use your hip muscles to lift your leg. Do not "throw" your leg or tip your body to lift your leg. 5. Hold this position for 1 second. 6. Slowly return to the starting position. Repeat for a total of 10 repetitions. Repeat 2 times. Complete this exercise 3 times per week. Exercise J: Squats (Quadriceps) 1. Stand in a door frame so your feet and knees are in line with the frame. You may place your hands on the frame for balance. 2. Slowly bend your knees and lower your hips like you are going to sit in a chair. ? Keep your lower legs in a straight-up-and-down position. ? Do not let your hips go lower than your  knees. ? Do not bend your knees lower than told by your health care provider. ? If your hip pain increases, do not bend as low. 3. Hold this position for 1 second. 4. Slowly push with your legs to return to standing. Do not use your hands to pull yourself to standing. Repeat for a total of 10 repetitions. Repeat 2 times. Complete this exercise 3 times per week. Make sure you discuss any questions you have with your health care provider. Document Released: 11/13/2005 Document Revised: 07/20/2016 Document Reviewed: 10/21/2015 Elsevier Interactive Patient Education  [employer]2018 [employer]

## 2018-09-24 ENCOUNTER — Other Ambulatory Visit: Payer: Self-pay | Admitting: Family Medicine

## 2018-09-24 DIAGNOSIS — E559 Vitamin D deficiency, unspecified: Secondary | ICD-10-CM

## 2018-09-24 LAB — COMPREHENSIVE METABOLIC PANEL
AG RATIO: 1.2 (calc) (ref 1.0–2.5)
ALBUMIN MSPROF: 4 g/dL (ref 3.6–5.1)
ALKALINE PHOSPHATASE (APISO): 73 U/L (ref 33–115)
ALT: 7 U/L (ref 6–29)
AST: 13 U/L (ref 10–30)
BILIRUBIN TOTAL: 0.7 mg/dL (ref 0.2–1.2)
BUN/Creatinine Ratio: 6 (calc) (ref 6–22)
BUN: 5 mg/dL — AB (ref 7–25)
CALCIUM: 9.2 mg/dL (ref 8.6–10.2)
CO2: 27 mmol/L (ref 20–32)
Chloride: 102 mmol/L (ref 98–110)
Creat: 0.89 mg/dL (ref 0.50–1.10)
Globulin: 3.3 g/dL (calc) (ref 1.9–3.7)
Glucose, Bld: 70 mg/dL (ref 65–99)
POTASSIUM: 4.2 mmol/L (ref 3.5–5.3)
Sodium: 137 mmol/L (ref 135–146)
Total Protein: 7.3 g/dL (ref 6.1–8.1)

## 2018-09-24 LAB — CBC
HEMATOCRIT: 40.8 % (ref 35.0–45.0)
Hemoglobin: 13.7 g/dL (ref 11.7–15.5)
MCH: 31.1 pg (ref 27.0–33.0)
MCHC: 33.6 g/dL (ref 32.0–36.0)
MCV: 92.7 fL (ref 80.0–100.0)
MPV: 10.2 fL (ref 7.5–12.5)
Platelets: 259 10*3/uL (ref 140–400)
RBC: 4.4 10*6/uL (ref 3.80–5.10)
RDW: 11.8 % (ref 11.0–15.0)
WBC: 8.6 10*3/uL (ref 3.8–10.8)

## 2018-09-24 LAB — LIPID PANEL
CHOLESTEROL: 129 mg/dL (ref <200)
HDL: 60 mg/dL (ref >50)
LDL CHOLESTEROL (CALC): 57 mg/dL
Non-HDL Cholesterol (Calc): 69 mg/dL (calc) (ref <130)
Total CHOL/HDL Ratio: 2.2 (calc) (ref <5.0)
Triglycerides: 50 mg/dL (ref <150)

## 2018-09-24 LAB — VITAMIN D 25 HYDROXY (VIT D DEFICIENCY, FRACTURES): VIT D 25 HYDROXY: 17 ng/mL — AB (ref 30–100)

## 2018-09-24 MED ORDER — VITAMIN D (ERGOCALCIFEROL) 1.25 MG (50000 UNIT) PO CAPS: 50000.0000 [IU] | ORAL_CAPSULE | ORAL | 0 refills | Status: DC

## 2018-10-26 DIAGNOSIS — Z1151 Encounter for screening for human papillomavirus (HPV): Secondary | ICD-10-CM | POA: Diagnosis not present

## 2018-10-26 DIAGNOSIS — Z113 Encounter for screening for infections with a predominantly sexual mode of transmission: Secondary | ICD-10-CM | POA: Diagnosis not present

## 2018-10-26 DIAGNOSIS — Z124 Encounter for screening for malignant neoplasm of cervix: Secondary | ICD-10-CM | POA: Diagnosis not present

## 2018-10-26 DIAGNOSIS — Z6825 Body mass index (BMI) 25.0-25.9, adult: Secondary | ICD-10-CM | POA: Diagnosis not present

## 2018-10-26 DIAGNOSIS — Z01419 Encounter for gynecological examination (general) (routine) without abnormal findings: Secondary | ICD-10-CM | POA: Diagnosis not present

## 2018-11-11 ENCOUNTER — Other Ambulatory Visit: Payer: Self-pay | Admitting: Family Medicine

## 2018-11-11 DIAGNOSIS — G43809 Other migraine, not intractable, without status migrainosus: Secondary | ICD-10-CM

## 2018-11-18 ENCOUNTER — Telehealth: Payer: Self-pay | Admitting: Family Medicine

## 2018-11-18 NOTE — Telephone Encounter (Signed)
That's fine TY

## 2018-11-18 NOTE — Telephone Encounter (Signed)
Copied from CRM [phone]. Topic: General - Other >> Nov 18, 2018  4:18 PM Terisa Starraylor, Brittany L wrote: Reason for CRM: Patient's insurance [employer], Kandee KeenCory is calling regarding the lab order that was ordered on 09/23/2018 for Vitamin D. He said that it was not covered by her insurance because it was placed as " routine ". She is asking that it be updated to Vitamin Deficiency so that the insurance will pay it. He said that it is going to cost her $200 or more if this is not changed. He can be reached at [phone]

## 2018-11-24 NOTE — Telephone Encounter (Signed)
If he can make addendum in his note stating that the Vit D for this date should have been ordered under Vit D deficiency, then you can have them correct the lab.  Make sense?  Thanks,  [place]

## 2018-12-01 NOTE — Telephone Encounter (Signed)
Done. TY.

## 2018-12-07 DIAGNOSIS — N946 Dysmenorrhea, unspecified: Secondary | ICD-10-CM | POA: Diagnosis not present

## 2018-12-15 ENCOUNTER — Other Ambulatory Visit: Payer: Self-pay | Admitting: Family Medicine

## 2018-12-22 ENCOUNTER — Encounter: Payer: Self-pay | Admitting: Family Medicine

## 2018-12-22 ENCOUNTER — Ambulatory Visit: Payer: BLUE CROSS/BLUE SHIELD | Admitting: Family Medicine

## 2018-12-22 VITALS — BP 102/60 | HR 127 | Temp 99.2°F | Ht 64.5 in | Wt 149.0 lb

## 2018-12-22 DIAGNOSIS — E559 Vitamin D deficiency, unspecified: Secondary | ICD-10-CM | POA: Diagnosis not present

## 2018-12-22 DIAGNOSIS — J0100 Acute maxillary sinusitis, unspecified: Secondary | ICD-10-CM

## 2018-12-22 LAB — VITAMIN D 25 HYDROXY (VIT D DEFICIENCY, FRACTURES): VITD: 35.16 ng/mL (ref 30.00–100.00)

## 2018-12-22 MED ORDER — DOXYCYCLINE HYCLATE 100 MG PO TABS: 100.0000 mg | ORAL_TABLET | Freq: Two times a day (BID) | ORAL | 0 refills | Status: AC

## 2018-12-22 NOTE — Progress Notes (Signed)
Chief Complaint  Patient presents with  . Cough    congestion  . Fever    [facility] here for URI complaints.  Duration: 3 days Associated symptoms: Fever (102 F), dental pain, sinus congestion, sinus pain, rhinorrhea and cough Denies: itchy watery eyes, ear pain, ear drainage, sore throat, wheezing and shortness of breath Treatment to date: OTC cold/flu meds, Theraflu Sick contacts: No  ROS:  Const: +fevers HEENT: As noted in HPI Lungs: No SOB  Past Medical History:  Diagnosis Date  . ALLERGIC RHINITIS   . ALLERGY, INGESTED FOOD, DERMATITIS   . ASTHMA   . Asthma   . Irritable bowel syndrome   . MIGRAINE HEADACHE   . PCOS (polycystic ovarian syndrome)     BP 102/60 (BP Location: Left Arm, Patient Position: Sitting, Cuff Size: Normal)   Pulse (!) 127   Temp 99.2 F (37.3 C) (Oral)   Ht 5' 4.5" (1.638 m)   Wt 149 lb (67.6 kg)   SpO2 97%   BMI 25.18 kg/m  General: Awake, alert, appears stated age HEENT: AT, [place], ears patent b/l and TM's neg, nares patent w/o discharge, +sinus ttp maxillary, pharynx pink and without exudates, MMM Neck: No masses or asymmetry Heart: RRR Lungs: CTAB, no accessory muscle use Psych: Age appropriate judgment and insight, normal mood and affect  Acute maxillary sinusitis, recurrence not specified - Plan: doxycycline (VIBRA-TABS) 100 MG tablet  Vitamin D deficiency - Plan: Vitamin D (25 hydroxy)  Orders as above. Given temperature, dental pain will tx.  Continue to push fluids, practice good hand hygiene, cover mouth when coughing. F/u prn. If starting to experience fevers, shaking, or shortness of breath, seek immediate care. Pt voiced understanding and agreement to the plan.  [name] [place], DO 12/22/18 2:30 PM

## 2018-12-22 NOTE — Patient Instructions (Addendum)
Continue to push fluids, practice good hand hygiene, and cover your mouth if you cough.  If you start having increasing fevers, shaking or shortness of breath, seek immediate care.  Ibuprofen 400-600 mg (2-3 over the counter strength tabs) every 6 hours as needed for pain.  OK to take Tylenol 1000 mg (2 extra strength tabs) or 975 mg (3 regular strength tabs) every 6 hours as needed.  Let us know if you need anything.  

## 2018-12-23 ENCOUNTER — Other Ambulatory Visit: Payer: Self-pay | Admitting: Family Medicine

## 2018-12-23 DIAGNOSIS — E559 Vitamin D deficiency, unspecified: Secondary | ICD-10-CM

## 2019-01-02 ENCOUNTER — Ambulatory Visit: Payer: BLUE CROSS/BLUE SHIELD | Admitting: Family Medicine

## 2019-01-02 ENCOUNTER — Ambulatory Visit: Payer: Self-pay | Admitting: *Deleted

## 2019-01-02 ENCOUNTER — Encounter: Payer: Self-pay | Admitting: Family Medicine

## 2019-01-02 VITALS — BP 100/68 | HR 96 | Temp 97.8°F | Ht 64.5 in | Wt 150.1 lb

## 2019-01-02 DIAGNOSIS — J0100 Acute maxillary sinusitis, unspecified: Secondary | ICD-10-CM | POA: Diagnosis not present

## 2019-01-02 DIAGNOSIS — J4520 Mild intermittent asthma, uncomplicated: Secondary | ICD-10-CM | POA: Diagnosis not present

## 2019-01-02 MED ORDER — ALBUTEROL SULFATE HFA 108 (90 BASE) MCG/ACT IN AERS: 2.0000 | INHALATION_SPRAY | RESPIRATORY_TRACT | 3 refills | Status: DC | PRN

## 2019-01-02 MED ORDER — CEFDINIR 300 MG PO CAPS: 300.0000 mg | ORAL_CAPSULE | Freq: Two times a day (BID) | ORAL | 0 refills | Status: AC

## 2019-01-02 MED ORDER — PREDNISONE 20 MG PO TABS: 40.0000 mg | ORAL_TABLET | Freq: Every day | ORAL | 0 refills | Status: AC

## 2019-01-02 MED ORDER — LEVOFLOXACIN 750 MG PO TABS: 750.0000 mg | ORAL_TABLET | Freq: Every day | ORAL | 0 refills | Status: DC

## 2019-01-02 MED ORDER — FLUCONAZOLE 150 MG PO TABS: 150.0000 mg | ORAL_TABLET | Freq: Every day | ORAL | 0 refills | Status: DC

## 2019-01-02 NOTE — Patient Instructions (Signed)
Continue to push fluids, practice good hand hygiene, and cover your mouth if you cough.  If you start having fevers, shaking or shortness of breath, seek immediate care.  If the medicine is too expensive, use the paper prescription.  Let us know if you need anything.

## 2019-01-02 NOTE — Addendum Note (Signed)
Addended by: Scharlene Gloss B on: 01/02/2019 12:45 PM   Modules accepted: Orders

## 2019-01-02 NOTE — Telephone Encounter (Addendum)
Facial sinus pressure and congestion with clear nasal drainage except occasional yellowish nasal drainage. Occasional dry cough with breathing cold air. Reports fever Friday thru Sunday up to 102.7 oral. Mild headache daily. Took coarse of abx, plus some older abx she had at home. Using Claritin OTC. Not using any nasal spray. Used OTC Theraflu one day and Tylenol cold tab another day, neither helped. Felt achy all over Friday until yesterday. Has chest tightness and feels winded with walking now. Feels achy around the mid to upper back. No availability today. Can be seen by Dr. Milinda Cave today at 11:45a per Lise Auer.  Reason for Disposition . [1] Taking antibiotic > 7 days AND [2] nasal discharge not improved  Answer Assessment - Initial Assessment Questions 1. ANTIBIOTIC: "What antibiotic are you receiving?" "How many times per day?"    Doxycycline 100 mg twice daily for 14 days. 2. ONSET: "When was the antibiotic started?"     2 weeks ago 3. PAIN: "How bad is the sinus pain?"   (Scale 1-10; mild, moderate or severe)   - MILD (1-3): doesn't interfere with normal activities    - MODERATE (4-7): interferes with normal activities (e.g., work or school) or awakens from sleep   - SEVERE (8-10): excruciating pain and patient unable to do any normal activities        7-8 4. FEVER: "Do you have a fever?" If so, ask: "What is it, how was it measured, and when did it start?"      Not today. Fever Friday until yesterday 5. SYMPTOMS: "Are there any other symptoms you're concerned about?" If so, ask: "When did it start?"     Back pain. 6. PREGNANCY: "Is there any chance you are pregnant?" "When was your last menstrual period?"     no  Protocols used: SINUS INFECTION ON ANTIBIOTIC FOLLOW-UP CALL-A-AH

## 2019-01-02 NOTE — Progress Notes (Signed)
Chief Complaint  Patient presents with  . Cough    runny nose  . Headache  . Shortness of Breath    [facility] here for URI complaints.  Duration: seen on 2/13 Associated symptoms: Fever (102.7 F), sinus congestion, sinus pain, rhinorrhea, shortness of breath and cough Denies: itchy watery eyes, ear pain, ear drainage, sore throat, wheezing and myalgia Treatment to date: Got better w Doxy, things came back a day later, Tylenol, Theraflu Sick contacts: No  ROS:  Const: + fevers HEENT: As noted in HPI Lungs: No SOB  Past Medical History:  Diagnosis Date  . ALLERGIC RHINITIS   . ALLERGY, INGESTED FOOD, DERMATITIS   . ASTHMA   . Asthma   . Irritable bowel syndrome   . MIGRAINE HEADACHE   . PCOS (polycystic ovarian syndrome)     BP 100/68 (BP Location: Left Arm, Patient Position: Sitting, Cuff Size: Normal)   Pulse 96   Temp 97.8 F (36.6 C) (Oral)   Ht 5' 4.5" (1.638 m)   Wt 150 lb 2 oz (68.1 kg)   SpO2 98%   BMI 25.37 kg/m  General: Awake, alert, appears stated age HEENT: AT, [place], ears patent b/l and TM's neg, nares patent w/o discharge, +max sinus ttp b/l, pharynx pink and without exudates, MMM Neck: No masses or asymmetry Heart: RRR Lungs: faint wheezing at bases, no accessory muscle use Psych: Age appropriate judgment and insight, normal mood and affect  Acute maxillary sinusitis, recurrence not specified - Plan: predniSONE (DELTASONE) 20 MG tablet, cefdinir (OMNICEF) 300 MG capsule, fluconazole (DIFLUCAN) 150 MG tablet, levofloxacin (LEVAQUIN) 750 MG tablet  Orders as above. PCN allergy is rash, will try cephalosporin. If too expensive, a written rx for Levaquin was given. Pred for mild wheezing, may also help sinus s/s's. If no improvement, will consider ENT referral vs imaging. Continue to push fluids, practice good hand hygiene, cover mouth when coughing. F/u prn. If starting to experience increasing fevers, shaking, or shortness of breath, seek immediate  care. Pt voiced understanding and agreement to the plan.  [name] [place], DO 01/02/19 12:11 PM

## 2019-01-03 ENCOUNTER — Ambulatory Visit: Payer: BLUE CROSS/BLUE SHIELD | Admitting: Family Medicine

## 2019-02-28 ENCOUNTER — Telehealth: Payer: Self-pay | Admitting: Family Medicine

## 2019-02-28 NOTE — Telephone Encounter (Signed)
Copied from CRM [id number]. Topic: General - Other >> Feb 17, 2019 11:15 AM Darron Doom wrote: Reason for CRM: Patient called to say that she is still receiving the bill for labs done for Vitamin D on 09/23/2018. Asking for someone to look into this and resubmit the bill with proper coding. Please advise

## 2019-03-01 NOTE — Telephone Encounter (Signed)
No, looks like she has vitamin d deficiency- so even though with cpe , it still has to be coded as vit. D deficeincy

## 2019-03-09 ENCOUNTER — Other Ambulatory Visit: Payer: Self-pay | Admitting: Family Medicine

## 2019-03-27 ENCOUNTER — Other Ambulatory Visit (INDEPENDENT_AMBULATORY_CARE_PROVIDER_SITE_OTHER): Payer: BLUE CROSS/BLUE SHIELD

## 2019-03-27 ENCOUNTER — Other Ambulatory Visit: Payer: Self-pay

## 2019-03-27 ENCOUNTER — Other Ambulatory Visit: Payer: Self-pay | Admitting: Family Medicine

## 2019-03-27 DIAGNOSIS — E559 Vitamin D deficiency, unspecified: Secondary | ICD-10-CM

## 2019-03-27 LAB — VITAMIN D 25 HYDROXY (VIT D DEFICIENCY, FRACTURES): VITD: 39.63 ng/mL (ref 30.00–100.00)

## 2019-05-05 DIAGNOSIS — S0501XA Injury of conjunctiva and corneal abrasion without foreign body, right eye, initial encounter: Secondary | ICD-10-CM | POA: Diagnosis not present

## 2019-05-08 DIAGNOSIS — S0501XA Injury of conjunctiva and corneal abrasion without foreign body, right eye, initial encounter: Secondary | ICD-10-CM | POA: Diagnosis not present

## 2019-05-09 DIAGNOSIS — H16001 Unspecified corneal ulcer, right eye: Secondary | ICD-10-CM | POA: Diagnosis not present

## 2019-05-11 DIAGNOSIS — H16001 Unspecified corneal ulcer, right eye: Secondary | ICD-10-CM | POA: Diagnosis not present

## 2019-05-18 DIAGNOSIS — H16001 Unspecified corneal ulcer, right eye: Secondary | ICD-10-CM | POA: Diagnosis not present

## 2019-05-18 NOTE — Telephone Encounter (Signed)
Talilia with [employer] called in with patient on the line requesting an update on the diagnosis code that was suppose to be resent. [place] [facility] stated  they still have not received anything. Talilia request that the information be resent. Cb# [phone]

## 2019-05-31 NOTE — Telephone Encounter (Signed)
This bill was submitted with proper coding of Vitamin D defeincy. The bill is correct. This was not a standard CPE/preventative lab test.

## 2019-06-06 ENCOUNTER — Other Ambulatory Visit: Payer: Self-pay | Admitting: Family Medicine

## 2019-06-16 DIAGNOSIS — H16001 Unspecified corneal ulcer, right eye: Secondary | ICD-10-CM | POA: Diagnosis not present

## 2019-06-21 ENCOUNTER — Encounter: Payer: Self-pay | Admitting: Family Medicine

## 2019-06-21 DIAGNOSIS — Z20828 Contact with and (suspected) exposure to other viral communicable diseases: Secondary | ICD-10-CM | POA: Diagnosis not present

## 2019-06-21 DIAGNOSIS — B349 Viral infection, unspecified: Secondary | ICD-10-CM | POA: Diagnosis not present

## 2019-06-23 ENCOUNTER — Other Ambulatory Visit: Payer: Self-pay

## 2019-06-23 ENCOUNTER — Ambulatory Visit (INDEPENDENT_AMBULATORY_CARE_PROVIDER_SITE_OTHER): Payer: BC Managed Care – PPO | Admitting: Family Medicine

## 2019-06-23 ENCOUNTER — Encounter: Payer: Self-pay | Admitting: Family Medicine

## 2019-06-23 DIAGNOSIS — J0100 Acute maxillary sinusitis, unspecified: Secondary | ICD-10-CM

## 2019-06-23 MED ORDER — DOXYCYCLINE HYCLATE 100 MG PO TABS: 100.0000 mg | ORAL_TABLET | Freq: Two times a day (BID) | ORAL | 0 refills | Status: AC

## 2019-06-23 MED ORDER — FLUCONAZOLE 150 MG PO TABS: 150.0000 mg | ORAL_TABLET | Freq: Every day | ORAL | 0 refills | Status: DC

## 2019-06-23 MED ORDER — PREDNISONE 20 MG PO TABS: 40.0000 mg | ORAL_TABLET | Freq: Every day | ORAL | 0 refills | Status: AC

## 2019-06-23 NOTE — Progress Notes (Signed)
Chief Complaint  Patient presents with  . Headache  . Sore Throat    [facility] here for URI complaints. Due to COVID-19 pandemic, we are interacting via web portal for an electronic face-to-face visit. I verified patient's ID using 2 identifiers. Patient agreed to proceed with visit via this method. Patient is at home, I am at office. Patient and I are present for visit.   Duration: 4 days  Associated symptoms: fevers 100 F Wednesday, sinus pain, rhinorrhea, ear pain, sore throat and cough Denies: sinus congestion, itchy watery eyes, ear drainage, wheezing, shortness of breath, myalgia and sob or GI s/s's Treatment to date: Claritin D, Alka Seltzer Sick contacts: No  ROS:  Const: Denies current fevers HEENT: As noted in HPI Lungs: No SOB  Past Medical History:  Diagnosis Date  . ALLERGIC RHINITIS   . ALLERGY, INGESTED FOOD, DERMATITIS   . ASTHMA   . Asthma   . Irritable bowel syndrome   . MIGRAINE HEADACHE   . PCOS (polycystic ovarian syndrome)    Exam No conversational dyspnea Age appropriate judgment and insight Nml affect and mood  Acute maxillary sinusitis, recurrence not specified - Plan: fluconazole (DIFLUCAN) 150 MG tablet, predniSONE (DELTASONE) 20 MG tablet, doxycycline (VIBRA-TABS) 100 MG tablet, ragweed known trigger of allergies. Go back to Zyrtec. Pred burst, doxy if no improvement. Diflucan should she get a yeast infection w abx.   Orders as above. Continue to push fluids, practice good hand hygiene, cover mouth when coughing. F/u prn. If starting to experience fevers, shaking, or shortness of breath, seek immediate care. Pt voiced understanding and agreement to the plan.  [place], DO 06/23/19 12:57 PM

## 2019-06-27 ENCOUNTER — Other Ambulatory Visit: Payer: Self-pay

## 2019-06-27 ENCOUNTER — Other Ambulatory Visit (INDEPENDENT_AMBULATORY_CARE_PROVIDER_SITE_OTHER): Payer: BC Managed Care – PPO

## 2019-06-27 DIAGNOSIS — E559 Vitamin D deficiency, unspecified: Secondary | ICD-10-CM

## 2019-06-27 LAB — VITAMIN D 25 HYDROXY (VIT D DEFICIENCY, FRACTURES): VITD: 35.04 ng/mL (ref 30.00–100.00)

## 2019-09-22 ENCOUNTER — Other Ambulatory Visit: Payer: Self-pay

## 2019-09-25 ENCOUNTER — Other Ambulatory Visit: Payer: Self-pay

## 2019-09-25 ENCOUNTER — Encounter: Payer: Self-pay | Admitting: Family Medicine

## 2019-09-25 ENCOUNTER — Ambulatory Visit (INDEPENDENT_AMBULATORY_CARE_PROVIDER_SITE_OTHER): Payer: BC Managed Care – PPO | Admitting: Family Medicine

## 2019-09-25 VITALS — BP 120/80 | HR 76 | Temp 98.0°F | Ht 64.5 in | Wt 153.4 lb

## 2019-09-25 DIAGNOSIS — E559 Vitamin D deficiency, unspecified: Secondary | ICD-10-CM

## 2019-09-25 DIAGNOSIS — R202 Paresthesia of skin: Secondary | ICD-10-CM

## 2019-09-25 DIAGNOSIS — Z0000 Encounter for general adult medical examination without abnormal findings: Secondary | ICD-10-CM | POA: Diagnosis not present

## 2019-09-25 NOTE — Patient Instructions (Addendum)
Give us 2-3 business days to get the results of your labs back.   Keep the diet clean and stay active.  If you do not hear anything about your referral in the next 1-2 weeks, call our office and ask for an update.  Let us know if you need anything. 

## 2019-09-25 NOTE — Progress Notes (Addendum)
Chief Complaint  Patient presents with  . Annual Exam     [name] is here for a complete physical.   Her last physical was >1 year ago.  Current diet: in general, a "healthy" diet. Current exercise: HIIT, wt resistance exercise Seatbelt? Yes  Health Maintenance Pap/HPV- Yes Tetanus- Yes HIV screening- Yes  Past Medical History:  Diagnosis Date  . ALLERGIC RHINITIS   . ALLERGY, INGESTED FOOD, DERMATITIS   . ASTHMA   . Asthma   . Irritable bowel syndrome   . MIGRAINE HEADACHE   . PCOS (polycystic ovarian syndrome)      Past Surgical History:  Procedure Laterality Date  . GYNECOLOGIC CRYOSURGERY      Medications  Current Outpatient Medications on File Prior to Visit  Medication Sig Dispense Refill  . albuterol (VENTOLIN HFA) 108 (90 Base) MCG/ACT inhaler Inhale 2 puffs into the lungs every 4 (four) hours as needed for wheezing or shortness of breath. 18 g 3  . Ascorbic Acid (VITAMIN C) 100 MG tablet Take 100 mg by mouth daily.    . Biotin 1 MG CAPS Take by mouth.    [name] [name] 0.35 MG tablet TK 1 T PO D  6  . cyanocobalamin 100 MCG tablet Take 100 mcg by mouth daily.    . fluconazole (DIFLUCAN) 150 MG tablet Take 1 tablet (150 mg total) by mouth daily. 1 tablet 0  . ibuprofen (ADVIL,MOTRIN) 200 MG tablet Take 200 mg by mouth every 6 (six) hours as needed.      . Prenatal Vit-Fe Fumarate-FA (PRENATAL MULTIVITAMIN) TABS tablet Take 1 tablet by mouth daily at 12 noon.    . SUMAtriptan (IMITREX) 100 MG tablet TAKE 1 TABLET ONCE AS      NEEDED 9 tablet 2  . Vitamin D, Ergocalciferol, (DRISDOL) 1.25 MG (50000 UT) CAPS capsule TAKE 1 CAPSULE BY MOUTH EVERY 7 DAYS 12 capsule 0    Allergies Allergies  Allergen Reactions  . Penicillins Rash    Review of Systems: Constitutional:  no unexpected weight changes Eye:  no recent significant change in vision Ear/Nose/Mouth/Throat:  Ears:  no tinnitus or vertigo and no recent change in hearing Nose/Mouth/Throat:   no complaints of nasal congestion, no sore throat Cardiovascular: no chest pain Respiratory:  no cough and no shortness of breath Gastrointestinal:  no abdominal pain, no change in bowel habits GU:  Female: negative for dysuria or pelvic pain Musculoskeletal/Extremities:  no pain of the joints Integumentary (Skin/Breast):  no abnormal skin lesions reported Neurologic:  no headaches; +tingling in b/l LE's Endocrine:  denies fatigue Hematologic/Lymphatic:  No areas of easy bleeding  Exam BP 120/80 (BP Location: Left Arm, Patient Position: Sitting, Cuff Size: Normal)   Pulse 76   Temp 98 F (36.7 C) (Temporal)   Ht 5' 4.5" (1.638 m)   Wt 153 lb 6 oz (69.6 kg)   SpO2 94%   BMI 25.92 kg/m  General:  well developed, well nourished, in no apparent distress Skin:  no significant moles, warts, or growths Head:  no masses, lesions, or tenderness Eyes:  pupils equal and round, sclera anicteric without injection Ears:  canals without lesions, TMs shiny without retraction, no obvious effusion, no erythema Nose:  nares patent, septum midline, mucosa normal, and no drainage or sinus tenderness Throat/Pharynx:  lips and gingiva without lesion; tongue and uvula midline; non-inflamed pharynx; no exudates or postnasal drainage Neck: neck supple without adenopathy, thyromegaly, or masses Lungs:  clear to auscultation, breath  sounds equal bilaterally, no respiratory distress Cardio:  regular rate and rhythm, no bruits, no LE edema Abdomen:  abdomen soft, nontender; bowel sounds normal; no masses or organomegaly Genital: Defer to GYN Musculoskeletal:  symmetrical muscle groups noted without atrophy or deformity Extremities:  no clubbing, cyanosis, or edema, no deformities, no skin discoloration Neuro:  gait normal; deep tendon reflexes normal and symmetric Psych: well oriented with normal range of affect and appropriate judgment/insight  Assessment and Plan  Well adult exam - Plan: B12, CBC, Comp  Met (CMET), Lipid Profile, Lipid Profile, Comp Met (CMET), CBC, B12, CANCELED: CBC, CANCELED: Comp Met (CMET), CANCELED: Lipid Profile, CANCELED: Vitamin D (25 hydroxy), CANCELED: B12  Paresthesias - Plan: Ambulatory referral to Sports Medicine  Vitamin D deficiency - Plan: Vitamin D (25 hydroxy), Vitamin D (25 hydroxy)   Well 38 y.o. female. Counseled on diet and exercise. Other orders as above. #2 related to msk issue in past, does better with exercise. She is no longer able to run, will refer to our sports med team for further advice.  Follow up 1 yr or prn. The patient voiced understanding and agreement to the plan.  [place], DO 11/05/19 10:17 AM

## 2019-09-28 ENCOUNTER — Other Ambulatory Visit: Payer: Self-pay

## 2019-09-28 ENCOUNTER — Other Ambulatory Visit: Payer: BC Managed Care – PPO

## 2019-09-28 LAB — COMPREHENSIVE METABOLIC PANEL
ALT: 9 U/L (ref 0–35)
AST: 14 U/L (ref 0–37)
Albumin: 3.9 g/dL (ref 3.5–5.2)
Alkaline Phosphatase: 71 U/L (ref 39–117)
BUN: 9 mg/dL (ref 6–23)
CO2: 26 mEq/L (ref 19–32)
Calcium: 9 mg/dL (ref 8.4–10.5)
Chloride: 107 mEq/L (ref 96–112)
Creatinine, Ser: 1.04 mg/dL (ref 0.40–1.20)
GFR: 72.48 mL/min (ref >60.00)
Glucose, Bld: 92 mg/dL (ref 70–99)
Potassium: 4.1 mEq/L (ref 3.5–5.1)
Sodium: 141 mEq/L (ref 135–145)
Total Bilirubin: 0.5 mg/dL (ref 0.2–1.2)
Total Protein: 6.8 g/dL (ref 6.0–8.3)

## 2019-09-28 LAB — CBC
HCT: 39 % (ref 36.0–46.0)
Hemoglobin: 13 g/dL (ref 12.0–15.0)
MCHC: 33.2 g/dL (ref 30.0–36.0)
MCV: 94.1 fl (ref 78.0–100.0)
Platelets: 254 10*3/uL (ref 150.0–400.0)
RBC: 4.15 Mil/uL (ref 3.87–5.11)
RDW: 13.2 % (ref 11.5–15.5)
WBC: 5.3 10*3/uL (ref 4.0–10.5)

## 2019-09-28 LAB — LIPID PANEL
Cholesterol: 152 mg/dL (ref 0–200)
HDL: 53.3 mg/dL (ref >39.00)
LDL Cholesterol: 88 mg/dL (ref 0–99)
NonHDL: 98.47
Total CHOL/HDL Ratio: 3
Triglycerides: 51 mg/dL (ref 0.0–149.0)
VLDL: 10.2 mg/dL (ref 0.0–40.0)

## 2019-09-28 LAB — VITAMIN B12: Vitamin B-12: 250 pg/mL (ref 211–911)

## 2019-09-28 LAB — VITAMIN D 25 HYDROXY (VIT D DEFICIENCY, FRACTURES): VITD: 31.45 ng/mL (ref 30.00–100.00)

## 2019-09-28 NOTE — Addendum Note (Signed)
Addended by: Caffie Pinto on: 09/28/2019 07:56 AM   Modules accepted: Orders

## 2019-09-28 NOTE — Addendum Note (Signed)
Addended by: Kellie Chisolm M on: 09/28/2019 07:56 AM   Modules accepted: Orders  

## 2019-10-10 ENCOUNTER — Encounter: Payer: Self-pay | Admitting: Family Medicine

## 2019-10-20 ENCOUNTER — Encounter: Payer: Self-pay | Admitting: Family Medicine

## 2019-10-20 ENCOUNTER — Ambulatory Visit: Payer: BC Managed Care – PPO | Admitting: Family Medicine

## 2019-10-20 ENCOUNTER — Other Ambulatory Visit: Payer: Self-pay

## 2019-10-20 DIAGNOSIS — R200 Anesthesia of skin: Secondary | ICD-10-CM

## 2019-10-20 NOTE — Progress Notes (Signed)
[name] - 38 y.o. female MRN [id number]  Date of birth: 10-Jun-1981  SUBJECTIVE:  Including CC & ROS.  Chief Complaint  Patient presents with  . Leg Pain    right leg    Christine Morgan is a 38 y.o. female that is presenting with altered sensations in the lower extremities.  Her symptoms have been present for a couple of years now.  She has been previously seen by myself who thought most of her symptoms related to hypermobility.  She has had limited improvement.  Has not had any imaging or blood work obtained.  Symptoms seem to be occurring on a more regular basis.  She is unable to run long distances.  She has been limiting herself to sprints as of late.  The symptoms seem to be occurring in the posterior aspect of each leg.  She notices them whenever she is running after a few minutes.  Denies any weakness.  No signs of atrophy.  Has not tried any medications.   Review of Systems  Constitutional: Negative for fever.  HENT: Negative for congestion.   Respiratory: Negative for cough.   Cardiovascular: Negative for chest pain.  Gastrointestinal: Negative for abdominal pain.  Musculoskeletal: Positive for gait problem.  Skin: Negative for color change.  Neurological: Negative for syncope.  Hematological: Negative for adenopathy.  Psychiatric/Behavioral: Negative for agitation.    HISTORY: Past Medical, Surgical, Social, and Family History Reviewed & Updated per EMR.   Pertinent Historical Findings include:  Past Medical History:  Diagnosis Date  . ALLERGIC RHINITIS   . ALLERGY, INGESTED FOOD, DERMATITIS   . ASTHMA   . Asthma   . Irritable bowel syndrome   . MIGRAINE HEADACHE   . PCOS (polycystic ovarian syndrome)     Past Surgical History:  Procedure Laterality Date  . GYNECOLOGIC CRYOSURGERY      Allergies  Allergen Reactions  . Penicillins Rash    Family History  Problem Relation Age of Onset  . Arthritis Mother   . Eczema Mother   . Hyperlipidemia Father     . Hypertension Father   . Arthritis Maternal Grandmother   . Cancer Maternal Grandmother   . Allergic rhinitis Maternal Grandmother   . Asthma Maternal Grandmother   . Arthritis Paternal Grandmother   . Heart disease Paternal Grandmother   . Liver disease Paternal Grandfather   . Eczema Sister   . Asthma Sister   . Angioedema Neg Hx   . Immunodeficiency Neg Hx   . Urticaria Neg Hx      Social History   Socioeconomic History  . Marital status: Single    Spouse name: Not on file  . Number of children: 0  . Years of education: 66  . Highest education level: Not on file  Occupational History  . Occupation: Chief Financial Officer  Tobacco Use  . Smoking status: Never Smoker  . Smokeless tobacco: Never Used  . Tobacco comment: Single-has boyfriend. Employed Architectural technologist at [employer]  Substance and Sexual Activity  . Alcohol use: Yes    Comment: Occ.  . Drug use: No  . Sexual activity: Yes    Birth control/protection: Pill  Other Topics Concern  . Not on file  Social History Narrative   Denies abuse and feels safe at home.   Social Determinants of Health   Financial Resource Strain:   . Difficulty of Paying Living Expenses: Not on file  Food Insecurity:   . Worried About Programme researcher, broadcasting/film/video in the  Last Year: Not on file  . Ran Out of Food in the Last Year: Not on file  Transportation Needs:   . Lack of Transportation (Medical): Not on file  . Lack of Transportation (Non-Medical): Not on file  Physical Activity:   . Days of Exercise per Week: Not on file  . Minutes of Exercise per Session: Not on file  Stress:   . Feeling of Stress : Not on file  Social Connections:   . Frequency of Communication with Friends and Family: Not on file  . Frequency of Social Gatherings with Friends and Family: Not on file  . Attends Religious Services: Not on file  . Active Member of Clubs or Organizations: Not on file  . Attends Archivist Meetings: Not on file  .  Marital Status: Not on file  Intimate Partner Violence:   . Fear of Current or Ex-Partner: Not on file  . Emotionally Abused: Not on file  . Physically Abused: Not on file  . Sexually Abused: Not on file     PHYSICAL EXAM:  VS: BP 115/78   Pulse 61   Ht 5\' 5"  (1.651 m)   Wt 151 lb (68.5 kg)   BMI 25.13 kg/m  Physical Exam Gen: NAD, alert, cooperative with exam, well-appearing ENT: normal lips, normal nasal mucosa,  Eye: normal EOM, normal conjunctiva and lids CV:  no edema, +2 pedal pulses   Resp: no accessory muscle use, non-labored,   Skin: no rashes, no areas of induration  Neuro: normal tone, normal sensation to touch Psych:  normal insight, alert and oriented MSK:  Right and left leg:  No signs of atrophy. Normal internal and external rotation of the hips bilaterally. Normal strength resistance with hip flexion, knee flexion extension, plantarflexion and dorsiflexion. Negative straight leg raise. Neurovascularly intact     ASSESSMENT & PLAN:   Leg numbness Symptoms have been ongoing for the past 2 years.  Has had limited work-up to this point.  No improvement with home modalities thus far.  Still likely contributing from her hypermobility.  Seems less likely to be a neuropathy. -Can consider physical therapy.  Could obtain rheumatologic work-up.  Will consider x-rays and EMG. -Counseled on home exercise therapy and supportive care. -Follow-up in 4 weeks.

## 2019-10-20 NOTE — Patient Instructions (Signed)
Nice to meet you again Please send me a message in a month and we can start with some of the physical therapy or blood work  Please send me a message in [place] with any questions or updates.  Please see me back in 4 weeks.   --Dr. Raeford Razor

## 2019-10-23 NOTE — Assessment & Plan Note (Signed)
Symptoms have been ongoing for the past 2 years.  Has had limited work-up to this point.  No improvement with home modalities thus far.  Still likely contributing from her hypermobility.  Seems less likely to be a neuropathy. -Can consider physical therapy.  Could obtain rheumatologic work-up.  Will consider x-rays and EMG. -Counseled on home exercise therapy and supportive care. -Follow-up in 4 weeks.

## 2019-10-30 DIAGNOSIS — R87810 Cervical high risk human papillomavirus (HPV) DNA test positive: Secondary | ICD-10-CM | POA: Diagnosis not present

## 2019-10-30 DIAGNOSIS — Z1159 Encounter for screening for other viral diseases: Secondary | ICD-10-CM | POA: Diagnosis not present

## 2019-10-30 DIAGNOSIS — Z01419 Encounter for gynecological examination (general) (routine) without abnormal findings: Secondary | ICD-10-CM | POA: Diagnosis not present

## 2019-10-30 DIAGNOSIS — Z113 Encounter for screening for infections with a predominantly sexual mode of transmission: Secondary | ICD-10-CM | POA: Diagnosis not present

## 2019-10-30 DIAGNOSIS — N760 Acute vaginitis: Secondary | ICD-10-CM | POA: Diagnosis not present

## 2019-10-30 DIAGNOSIS — Z1151 Encounter for screening for human papillomavirus (HPV): Secondary | ICD-10-CM | POA: Diagnosis not present

## 2019-10-30 DIAGNOSIS — Z118 Encounter for screening for other infectious and parasitic diseases: Secondary | ICD-10-CM | POA: Diagnosis not present

## 2019-10-30 DIAGNOSIS — Z6826 Body mass index (BMI) 26.0-26.9, adult: Secondary | ICD-10-CM | POA: Diagnosis not present

## 2019-10-30 DIAGNOSIS — N898 Other specified noninflammatory disorders of vagina: Secondary | ICD-10-CM | POA: Diagnosis not present

## 2020-01-03 ENCOUNTER — Encounter: Payer: Self-pay | Admitting: Family Medicine

## 2020-01-03 DIAGNOSIS — G43809 Other migraine, not intractable, without status migrainosus: Secondary | ICD-10-CM

## 2020-01-04 MED ORDER — SUMATRIPTAN SUCCINATE 100 MG PO TABS: ORAL_TABLET | ORAL | 2 refills | Status: DC

## 2020-01-26 ENCOUNTER — Encounter: Payer: Self-pay | Admitting: Family Medicine

## 2020-01-30 ENCOUNTER — Other Ambulatory Visit: Payer: Self-pay | Admitting: Family Medicine

## 2020-01-30 DIAGNOSIS — M2550 Pain in unspecified joint: Secondary | ICD-10-CM

## 2020-02-02 DIAGNOSIS — M2550 Pain in unspecified joint: Secondary | ICD-10-CM | POA: Diagnosis not present

## 2020-02-06 ENCOUNTER — Telehealth: Payer: Self-pay | Admitting: Family Medicine

## 2020-02-06 LAB — ANA,IFA RA DIAG PNL W/RFLX TIT/PATN
ANA Titer 1: NEGATIVE
Cyclic Citrullin Peptide Ab: 8 U (ref 0–19)
Rheumatoid fact SerPl-aCnc: <10 [IU]/mL (ref 0.0–13.9)

## 2020-02-06 NOTE — Telephone Encounter (Signed)
Left VM for patient. If she calls back please have her speak with a nurse/CMA and inform that her labs are normal. We can proceed with physical therapy if she would like.   If any questions then please take the best time and phone number to call and I will try to call her back.   Myra Rude, MD [facility] 02/06/2020, 4:11 PM

## 2020-02-06 NOTE — Telephone Encounter (Signed)
Spoke to patient and gave result information as provided by the physician. 

## 2020-02-06 NOTE — Telephone Encounter (Signed)
Patient returning call for lab results. 

## 2020-02-07 ENCOUNTER — Other Ambulatory Visit: Payer: Self-pay | Admitting: Family Medicine

## 2020-02-07 DIAGNOSIS — M2550 Pain in unspecified joint: Secondary | ICD-10-CM

## 2020-02-07 DIAGNOSIS — M357 Hypermobility syndrome: Secondary | ICD-10-CM

## 2020-02-07 DIAGNOSIS — R200 Anesthesia of skin: Secondary | ICD-10-CM

## 2020-02-09 ENCOUNTER — Ambulatory Visit: Payer: BC Managed Care – PPO | Attending: Internal Medicine

## 2020-02-09 DIAGNOSIS — Z23 Encounter for immunization: Secondary | ICD-10-CM

## 2020-02-09 NOTE — Progress Notes (Signed)
   Covid-19 Vaccination Clinic  Name:  Christine Morgan    MRN: [id number] DOB: [birth year]  02/09/2020  [name] was observed post Covid-19 immunization for 15 minutes without incident. She was provided with Vaccine Information Sheet and instruction to access the V-Safe system.   [name] was instructed to call 911 with any severe reactions post vaccine: [name] Difficulty breathing  . Swelling of face and throat  . A fast heartbeat  . A bad rash all over body  . Dizziness and weakness   Immunizations Administered    Name Date Dose VIS Date Route   Pfizer COVID-19 Vaccine 02/09/2020 10:48 AM 0.3 mL 10/20/2019 Intramuscular   Manufacturer: [employer], [employer]   Lot: [id number]   NDC: [address]-7

## 2020-02-15 ENCOUNTER — Encounter: Payer: Self-pay | Admitting: Physical Therapy

## 2020-02-15 ENCOUNTER — Other Ambulatory Visit: Payer: Self-pay

## 2020-02-15 ENCOUNTER — Ambulatory Visit: Payer: BC Managed Care – PPO | Attending: Family Medicine | Admitting: Physical Therapy

## 2020-02-15 DIAGNOSIS — M6281 Muscle weakness (generalized): Secondary | ICD-10-CM

## 2020-02-15 DIAGNOSIS — R29818 Other symptoms and signs involving the nervous system: Secondary | ICD-10-CM | POA: Insufficient documentation

## 2020-02-15 DIAGNOSIS — R29898 Other symptoms and signs involving the musculoskeletal system: Secondary | ICD-10-CM | POA: Diagnosis not present

## 2020-02-15 NOTE — Therapy (Signed)
[facility] [address]  [place], [place], [address] Phone: [phone]   Fax:  [phone]  Physical Therapy Evaluation  Patient Details  Name: Christine Morgan MRN: [id number] Date of Birth: [birth year] Referring Provider (PT): Clearance Coots, MD   Encounter Date: 02/15/2020  PT End of Session - 02/15/20 1116    Visit Number  1    Number of Visits  7    Authorization Type  BCBS    PT Start Time  0800    PT Stop Time  0845    PT Time Calculation (min)  45 min    Activity Tolerance  Patient tolerated treatment well    Behavior During Therapy  [facility] for tasks assessed/performed       Past Medical History:  Diagnosis Date  . ALLERGIC RHINITIS   . ALLERGY, INGESTED FOOD, DERMATITIS   . ASTHMA   . Asthma   . Irritable bowel syndrome   . MIGRAINE HEADACHE   . PCOS (polycystic ovarian syndrome)     Past Surgical History:  Procedure Laterality Date  . GYNECOLOGIC CRYOSURGERY      There were no vitals filed for this visit.   Subjective Assessment - 02/15/20 0801    Subjective  Patient reports that progressively for the past 4-5 years, she began to feel numbness in the R lateral lower leg to toes when running ~1 mile. This may also occur in digits 1-2 on L LE, but less frequently. This was first noticed with running, but lately it occurs sporadically. May also occur when performing knee ups at the gym, figure 4 stretch with leg elevated, and with sexual intercourse. Also noticing decreased R cervical sidebending AROM compared to L but does not have pain. Denies N/T in UEs. Does notice sensation of "hitting her funny bone" in L elbow and R knee.    Pertinent History  migraines, asthma    Limitations  House hold activities;Sitting    Diagnostic tests  none    Patient Stated Goals  "get back to doing sprints and understand what I can and cannot do"    Currently in Pain?  No/denies    Pain Score  0-No pain    Pain  Location  Leg    Pain Orientation  Right    Pain Descriptors / Indicators  Tingling;Numbness    Pain Type  Chronic pain         [facility] PT Assessment - 02/15/20 0810      Assessment   Medical Diagnosis  Arthralgia of multiple joints, leg numbness, hypermobility syndrome    Referring Provider (PT)  Clearance Coots, MD    Onset Date/Surgical Date  --   past 4-5 years   Next MD Visit  not scheduled    Prior Therapy  yes      Balance Screen   Has the patient fallen in the past 6 months  No    Has the patient had a decrease in activity level because of a fear of falling?   No    Is the patient reluctant to leave their home because of a fear of falling?   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    Leisure  running      Cognition   Overall Cognitive Status  Within Functional Limits for tasks assessed      Sensation   Light Touch  Appears Intact      Coordination   Gross Motor Movements are Fluid and Coordinated  Yes      Posture/Postural Control   Posture/Postural Control  Postural limitations    Postural Limitations  Rounded Shoulders;Forward head      ROM / Strength   AROM / PROM / Strength  Strength;AROM      AROM   AROM Assessment Site  Lumbar;Ankle    Right/Left Ankle  Right;Left    Right Ankle Dorsiflexion  0   R plantar surface N/T   Left Ankle Dorsiflexion  10    Lumbar Flexion  floor   R plantar surface N/T   Lumbar Extension  WNL    Lumbar - Right Side Bend  proximal shin    Lumbar - Left Side Bend  proximal shin    Lumbar - Right Rotation  WNL    Lumbar - Left Rotation  WNL      Strength   Strength Assessment Site  Hip;Knee;Ankle    Right/Left Hip  Right;Left    Right Hip  Flexion  4+/5    Right Hip ABduction  4/5    Right Hip ADduction  4/5    Left Hip Flexion  4+/5    Left Hip ABduction  4/5    Left Hip ADduction  4/5    Right/Left Knee  Right;Left    Right Knee Flexion  4+/5    Right Knee Extension  4/5    Left Knee Flexion  4+/5    Left Knee Extension  4+/5    Right/Left Ankle  Right;Left    Right Ankle Dorsiflexion  4+/5    Right Ankle Plantar Flexion  4/5   20 reps but heavy UE compensation; instability    Right Ankle Inversion  4+/5    Right Ankle Eversion  4+/5    Left Ankle Dorsiflexion  4-/5    Left Ankle Plantar Flexion  4/5   20 reps but heavy UE compensation; instability    Left Ankle Inversion  3+/5    Left Ankle Eversion  3+/5      Flexibility   Soft Tissue Assessment /Muscle Length  yes    Hamstrings  B WNL    Quadriceps  B WNL    Piriformis  B mildly tight in figure 4, c/o N/T in R lateral foot with KTOS   c/o N/T in R buttock with pressure over proximal glute     Palpation   Palpation comment  no TTP along R calf; increased soft tissue restriction in R lateral gastroc, and medial HS, B proximal glutes and piriformis      Ambulation/Gait   Gait Pattern  Step-through pattern;Within Functional Limits    Ambulation Surface  Level;Indoor    Gait velocity  WNL                Objective measurements completed on examination: See above findings.              PT Education - 02/15/20 1116    Education Details  prognosis, POC, HEP    Person(s) Educated  Patient    Methods  Explanation;Demonstration;Tactile cues;Verbal cues;Handout    Comprehension  Verbalized understanding;Returned demonstration       PT Short Term Goals - 02/15/20 1130      PT SHORT TERM GOAL #1   Title  Patient to be independent with initial HEP.    Time  3    Period  Weeks    Status  New    Target Date  03/07/20        PT Long Term Goals - 02/15/20 1130      PT LONG TERM GOAL #1   Title  Patient to be independent with  advanced HEP.    Time  6    Period  Weeks    Status  New    Target Date  03/28/20      PT LONG TERM GOAL #2   Title  Patient to demonstrate B LE strength >/=4+/5.    Time  6    Period  Weeks    Status  New    Target Date  03/28/20      PT LONG TERM GOAL #3   Title  Patient to demonstrate B ankle dorsiflexion 10 degrees R LE, 15 degrees L LE.    Time  6    Period  Weeks    Status  New    Target Date  03/28/20      PT LONG TERM GOAL #4   Title  Patient to report tolerance for running 1 mile without c/o N/T.    Time  6    Period  Weeks    Status  New    Target Date  03/28/20      PT LONG TERM GOAL #5   Title  Patient to demonstrate B SLS on foam for 30 sec with mild sway to improve ankle stability.    Time  6    Period  Weeks    Status  New    Target Date  03/28/20             Plan - 02/15/20 1117    Clinical Impression Statement  Patient is a 38y/o F presenting to OPPT with c/o progressive N/T in R>L lower legs for the past 4-5 years. This sensation first began when running and approaching 1 mile. N/T occurs in the R lateral lower leg to toes. This may also occur in digits 1-2 on L LE, but less frequently. Recently this sensation is also brought on by performing knee ups at the gym, figure 4 stretch with leg elevated, and with sexual intercourse. Denies N/T in UEs. Referring MD noting hypermobility syndrome, which may be causing patient's paresthesia.  Patient today presenting with rounded and forward head posture, limited B ankle dorsiflexion- R>L, B LE weakness, apparent B ankle instability with dynamic movements, tightness in B piriformis, and increased soft tissue restriction in R lateral gastroc, and medial HS, B proximal glutes and piriformis. Patient's c/o N/T was reproduced today with R ankle DF, lumbar flexion, and pressure over R proximal glutes. N/T does not seem to follow a consistent pattern. Patient educated on gentle stretching and strengthening HEP and all  questions answered. Would benefit from skilled PT services 1x/week for 6 weeks to address aforementioned impairments    Personal Factors and Comorbidities  Comorbidity 2;Fitness;Past/Current Experience;Profession;Time since onset of injury/illness/exacerbation    Comorbidities  migraines, asthma    Examination-Activity Limitations  Other;Sleep;Caring for Others   running   Examination-Participation Restrictions  --   gym   Stability/Clinical Decision Making  Unstable/Unpredictable    Clinical Decision Making  High    Rehab Potential  Good    PT Frequency  1x / week    PT Duration  6 weeks    PT Treatment/Interventions  ADLs/Self Care Home Management;Cryotherapy;Electrical Stimulation;Moist Heat;Balance training;Therapeutic exercise;Therapeutic activities;Functional mobility training;Stair training;Gait training;Ultrasound;Neuromuscular re-education;Patient/family education;Manual techniques;Vasopneumatic Device;Taping;Energy conservation;Dry needling;Passive range of motion    PT Next Visit Plan  reassess HEP; ankle and hip stability training, STM to gastrocs/glutes, piriformis and gastroc stretching    Consulted and Agree with Plan of Care  Patient       Patient will benefit from skilled therapeutic intervention in order to improve the following deficits and impairments:  Decreased activity tolerance, Decreased strength, Decreased balance, Increased muscle spasms, Improper body mechanics, Decreased range of motion, Hypermobility, Impaired flexibility, Postural dysfunction  Visit Diagnosis: Other symptoms and signs involving the nervous system  Other symptoms and signs involving the musculoskeletal system  Muscle weakness (generalized)     Problem List Patient Active Problem List   Diagnosis Date Noted  . Vitamin D deficiency 12/22/2018  . Leg numbness 08/12/2017  . Hypermobility syndrome 08/12/2017  . Flu-like symptoms 12/23/2016  . Sinusitis, acute 10/11/2014  . Routine  general medical examination at a health care facility 08/28/2014  . Knee pain, bilateral 08/28/2014  . Bug bite 08/28/2014  . Eustachian tube dysfunction 09/06/2013  . Migraine headache 11/24/2010  . ALLERGIC RHINITIS 11/24/2010  . Asthma 11/24/2010  . Irritable bowel syndrome 11/24/2010  . ALLERGY, INGESTED FOOD, DERMATITIS 11/24/2010    Anette Guarneri, PT, DPT 02/15/20 11:34 AM   [facility] [address]  [address] [place], [place], [address] Phone: [phone]   Fax:  [phone]  Name: Christine Morgan MRN: [id number] Date of Birth: [birth year]

## 2020-02-19 ENCOUNTER — Encounter: Payer: Self-pay | Admitting: Physical Therapy

## 2020-02-19 ENCOUNTER — Other Ambulatory Visit: Payer: Self-pay

## 2020-02-19 ENCOUNTER — Ambulatory Visit: Payer: BC Managed Care – PPO | Admitting: Physical Therapy

## 2020-02-19 DIAGNOSIS — M6281 Muscle weakness (generalized): Secondary | ICD-10-CM

## 2020-02-19 DIAGNOSIS — R29898 Other symptoms and signs involving the musculoskeletal system: Secondary | ICD-10-CM

## 2020-02-19 DIAGNOSIS — R29818 Other symptoms and signs involving the nervous system: Secondary | ICD-10-CM

## 2020-02-19 NOTE — Therapy (Signed)
[facility] [address]  [address] [place], [place], [address] Phone: [phone]   Fax:  [phone]  Physical Therapy Treatment  Patient Details  Name: Christine Morgan MRN: [id number] Date of Birth: 04/02/1981 Referring Provider (PT): Clare Gandy, MD   Encounter Date: 02/19/2020  PT End of Session - 02/19/20 1230    Visit Number  2    Number of Visits  7    Authorization Type  BCBS    PT Start Time  0802    PT Stop Time  0843    PT Time Calculation (min)  41 min    Activity Tolerance  Patient tolerated treatment well    Behavior During Therapy  [facility] for tasks assessed/performed       Past Medical History:  Diagnosis Date  . ALLERGIC RHINITIS   . ALLERGY, INGESTED FOOD, DERMATITIS   . ASTHMA   . Asthma   . Irritable bowel syndrome   . MIGRAINE HEADACHE   . PCOS (polycystic ovarian syndrome)     Past Surgical History:  Procedure Laterality Date  . GYNECOLOGIC CRYOSURGERY      There were no vitals filed for this visit.  Subjective Assessment - 02/19/20 0802    Subjective  Doing well. Denies questions with HEP. Noticed that clamshells helped with her LBP.    Pertinent History  migraines, asthma    Diagnostic tests  none    Patient Stated Goals  "get back to doing sprints and understand what I can and cannot do"    Currently in Pain?  No/denies                       [facility] Adult PT Treatment/Exercise - 02/19/20 0001      Self-Care   Self-Care  Other Self-Care Comments    Other Self-Care Comments   edu and practice on self-STM using ball on wall to R glute      Exercises   Exercises  Knee/Hip;Ankle      Knee/Hip Exercises: Stretches   Scientific laboratory technician Limitations  prone with strap and pillow under knee    Piriformis Stretch  Right;Left;1 rep;30 seconds    Piriformis Stretch Limitations  sitting fig 4 stretch    Other Knee/Hip Stretches  sitting  R/L KTOS stretch 30" each       Knee/Hip Exercises: Aerobic   Recumbent Bike  L1 x      Knee/Hip Exercises: Supine   Bridges  Strengthening;Both;1 set;10 reps    Bridges Limitations  straight leg bridge on orange pball   difficulty d/t cueing to avoid locking out knees   Bridges with [employer]  Strengthening;Both;1 set;10 reps   good form     Knee/Hip Exercises: Sidelying   Clams  x10 with red TB each side   good form     Knee/Hip Exercises: Prone   Hip Extension  Strengthening;Right;Left;2 sets;10 reps    Hip Extension Limitations  1st set bent knee, 2nd set straight knee   cues to control descent   Other Prone Exercises  child's pose 5x10"      Manual Therapy   Manual Therapy  Soft tissue mobilization;Myofascial release    Manual therapy comments  prone    Soft tissue mobilization  STM to R proximal and lateral glutes- TTP throughout and palpable soft tissue restriction in glutes   c/o N/T in R plantar surface  and lateral lower leg   Myofascial Release  TPR to R lateral glutes       Ankle Exercises: Stretches   Gastroc Stretch  1 rep;30 seconds    Gastroc Stretch Limitations  each LE; toes on wall             PT Education - 02/19/20 1230    Education Details  update to HEP    Person(s) Educated  Patient    Methods  Explanation;Demonstration;Tactile cues;Verbal cues;Handout    Comprehension  Verbalized understanding;Returned demonstration       PT Short Term Goals - 02/19/20 1231      PT SHORT TERM GOAL #1   Title  Patient to be independent with initial HEP.    Time  3    Period  Weeks    Status  On-going    Target Date  03/07/20        PT Long Term Goals - 02/19/20 1231      PT LONG TERM GOAL #1   Title  Patient to be independent with advanced HEP.    Time  6    Period  Weeks    Status  On-going      PT LONG TERM GOAL #2   Title  Patient to demonstrate B LE strength >/=4+/5.    Time  6    Period  Weeks    Status  On-going      PT  LONG TERM GOAL #3   Title  Patient to demonstrate B ankle dorsiflexion 10 degrees R LE, 15 degrees L LE.    Time  6    Period  Weeks    Status  On-going      PT LONG TERM GOAL #4   Title  Patient to report tolerance for running 1 mile without c/o N/T.    Time  6    Period  Weeks    Status  On-going      PT LONG TERM GOAL #5   Title  Patient to demonstrate B SLS on foam for 30 sec with mild sway to improve ankle stability.    Time  6    Period  Weeks    Status  On-going            Plan - 02/19/20 1230    Clinical Impression Statement  Patient reporting no new complaints today. Reported improvement in LBP since performing clamshells. Was able to perform sprinting over the weekend with c/o N/T in the R patellar tendon. Initiated prone quad stretching, which patient responded to well. Reviewed HEP for max benefit, with mild correction of form required with gastroc stretching. Progressed hip strengthening with patient requiring intermittent cueing to maintain controlled eccentric phase. Patient with c/o LBP and tension after prone exercises, thus worked on gentle lumbopelvic stretching to relieve this. D/t patient's c/o tension in LB, addressed this with STM and TPR to R proximal and lateral glutes. Patient with palpable soft tissue restriction and trigger point in these muscles and patient reporting reproduction of N/T in R plantar surface and lateral lower leg. Patient reported good relief from MT, thus educated patient on self-STM to R buttock. Patient without complaints at end of session. Patient progressing well.    Comorbidities  migraines, asthma    PT Treatment/Interventions  ADLs/Self Care Home Management;Cryotherapy;Electrical Stimulation;Moist Heat;Balance training;Therapeutic exercise;Therapeutic activities;Functional mobility training;Stair training;Gait training;Ultrasound;Neuromuscular re-education;Patient/family education;Manual techniques;Vasopneumatic Device;Taping;Energy  conservation;Dry needling;Passive range of motion    PT Next Visit Plan  ankle  and hip stability training, STM to gastrocs/glutes, piriformis and gastroc stretching    Consulted and Agree with Plan of Care  Patient       Patient will benefit from skilled therapeutic intervention in order to improve the following deficits and impairments:  Decreased activity tolerance, Decreased strength, Decreased balance, Increased muscle spasms, Improper body mechanics, Decreased range of motion, Hypermobility, Impaired flexibility, Postural dysfunction  Visit Diagnosis: Other symptoms and signs involving the nervous system  Other symptoms and signs involving the musculoskeletal system  Muscle weakness (generalized)     Problem List Patient Active Problem List   Diagnosis Date Noted  . Vitamin D deficiency 12/22/2018  . Leg numbness 08/12/2017  . Hypermobility syndrome 08/12/2017  . Flu-like symptoms 12/23/2016  . Sinusitis, acute 10/11/2014  . Routine general medical examination at a health care facility 08/28/2014  . Knee pain, bilateral 08/28/2014  . Bug bite 08/28/2014  . Eustachian tube dysfunction 09/06/2013  . Migraine headache 11/24/2010  . ALLERGIC RHINITIS 11/24/2010  . Asthma 11/24/2010  . Irritable bowel syndrome 11/24/2010  . ALLERGY, INGESTED FOOD, DERMATITIS 11/24/2010     Janene Harvey, PT, DPT 02/19/20 12:32 PM   [facility] [address]  [place], [place], [address] Phone: [phone]   Fax:  [phone]  Name: Christine Morgan MRN: [id number] Date of Birth: 10-11-1981

## 2020-02-26 ENCOUNTER — Ambulatory Visit: Payer: BC Managed Care – PPO

## 2020-02-26 ENCOUNTER — Other Ambulatory Visit: Payer: Self-pay

## 2020-02-26 DIAGNOSIS — M6281 Muscle weakness (generalized): Secondary | ICD-10-CM

## 2020-02-26 DIAGNOSIS — R29898 Other symptoms and signs involving the musculoskeletal system: Secondary | ICD-10-CM | POA: Diagnosis not present

## 2020-02-26 DIAGNOSIS — R29818 Other symptoms and signs involving the nervous system: Secondary | ICD-10-CM

## 2020-02-26 NOTE — Therapy (Signed)
[facility] [address]  [address] [place], [place], [address] Phone: [phone]   Fax:  [phone]  Physical Therapy Treatment  Patient Details  Name: Christine Morgan MRN: [id number] Date of Birth: August 08, 1981 Referring Provider (PT): Clare Gandy, MD   Encounter Date: 02/26/2020  PT End of Session - 02/26/20 0814    Visit Number  3    Number of Visits  7    Authorization Type  BCBS    PT Start Time  0802    PT Stop Time  0845    PT Time Calculation (min)  43 min    Activity Tolerance  Patient tolerated treatment well    Behavior During Therapy  [facility] for tasks assessed/performed       Past Medical History:  Diagnosis Date  . ALLERGIC RHINITIS   . ALLERGY, INGESTED FOOD, DERMATITIS   . ASTHMA   . Asthma   . Irritable bowel syndrome   . MIGRAINE HEADACHE   . PCOS (polycystic ovarian syndrome)     Past Surgical History:  Procedure Laterality Date  . GYNECOLOGIC CRYOSURGERY      There were no vitals filed for this visit.  Subjective Assessment - 02/26/20 0804    Subjective  Pt. reporting she has felt some improvement in numbness and tingling with workouts.    Pertinent History  migraines, asthma    Patient Stated Goals  "get back to doing sprints and understand what I can and cannot do"    Currently in Pain?  No/denies    Pain Score  0-No pain   tingling numbness up to 3/10 intensity after prolonged walking   Pain Location  Leg    Pain Orientation  Right    Pain Descriptors / Indicators  Tingling;Numbness    Pain Type  Chronic pain    Multiple Pain Sites  No                       [facility] Adult PT Treatment/Exercise - 02/26/20 0001      Knee/Hip Exercises: Stretches   Scientific laboratory technician Limitations  prone with strap     Hip Flexor Stretch  Right;Left;1 rep;30 seconds    Hip Flexor Stretch Limitations  supine with strap in mod thomas position      ITB Stretch  Right;Left;1 rep;30 seconds    ITB Stretch Limitations  B sidelying     Piriformis Stretch  Right;Left;1 rep;30 seconds    Piriformis Stretch Limitations  sitting fig 4 stretch    Gastroc Stretch  Right;Left;1 rep;30 seconds    Gastroc Stretch Limitations  runners stretch     Other Knee/Hip Stretches  B glute rolling x 30 sec     Other Knee/Hip Stretches  sitting R/L KTOS stretch 30" each       Knee/Hip Exercises: Aerobic   Recumbent Bike  L1 x      Knee/Hip Exercises: Standing   Hip Flexion  Right;10 reps;Knee straight;1 set;Stengthening    Hip Flexion Limitations  L SLS; yellow TB on R; 1 ski pole    Hip ADduction  Right;10 reps;Strengthening;1 set    Hip ADduction Limitations  L SLS; yellow TB at R ankle; 1 ski po.le    Hip Abduction  Right;10 reps;Knee straight;1 set;Stengthening    Abduction Limitations  L SLS; yellow TB at R ankle; 1 ski poles     Hip Extension  Right;10 reps;Knee straight;Stengthening;1 set    Extension Limitations  L SLS; yellow TB at R ankle; 1 ski pole s      Knee/Hip Exercises: Supine   Bridges with Clamshell  Both;Strengthening   x 12 reps: + isometric hip abd/ER with red TB               PT Short Term Goals - 02/26/20 0815      PT SHORT TERM GOAL #1   Title  Patient to be independent with initial HEP.    Time  3    Period  Weeks    Status  Achieved    Target Date  03/07/20        PT Long Term Goals - 02/19/20 1231      PT LONG TERM GOAL #1   Title  Patient to be independent with advanced HEP.    Time  6    Period  Weeks    Status  On-going      PT LONG TERM GOAL #2   Title  Patient to demonstrate B LE strength >/=4+/5.    Time  6    Period  Weeks    Status  On-going      PT LONG TERM GOAL #3   Title  Patient to demonstrate B ankle dorsiflexion 10 degrees R LE, 15 degrees L LE.    Time  6    Period  Weeks    Status  On-going      PT LONG TERM GOAL #4   Title  Patient to report tolerance for running 1  mile without c/o N/T.    Time  6    Period  Weeks    Status  On-going      PT LONG TERM GOAL #5   Title  Patient to demonstrate B SLS on foam for 30 sec with mild sway to improve ankle stability.    Time  6    Period  Weeks    Status  On-going            Plan - 02/26/20 0816    Clinical Impression Statement  Pt. reporting she was able to run for 1 mile and did not have as much tingling/numbness in LE on Sunday.  Targeted standing therex to L ankle/hip stability and strengthening with pt. reporting muscular fatigue following however denied pain.  Pt. verbalizing/demonstrating good overall understanding of HEP in session today.  STG #1 achieved.  Will plan to progress LE flexibility, strengthening and stability training per pt. tolerance in coming sessions.    Comorbidities  migraines, asthma    Rehab Potential  Good    PT Frequency  1x / week    PT Treatment/Interventions  ADLs/Self Care Home Management;Cryotherapy;Electrical Stimulation;Moist Heat;Balance training;Therapeutic exercise;Therapeutic activities;Functional mobility training;Stair training;Gait training;Ultrasound;Neuromuscular re-education;Patient/family education;Manual techniques;Vasopneumatic Device;Taping;Energy conservation;Dry needling;Passive range of motion    PT Next Visit Plan  ankle and hip stability training, STM to gastrocs/glutes, piriformis and gastroc stretching    Consulted and Agree with Plan of Care  Patient       Patient will benefit from skilled therapeutic intervention in order to improve the following deficits and impairments:  Decreased activity tolerance, Decreased strength, Decreased balance, Increased muscle spasms, Improper body mechanics, Decreased range of motion, Hypermobility, Impaired flexibility, Postural dysfunction  Visit Diagnosis: Other symptoms and signs involving the nervous system  Other symptoms and signs involving the musculoskeletal system  Muscle weakness  (generalized)     Problem List Patient  Active Problem List   Diagnosis Date Noted  . Vitamin D deficiency 12/22/2018  . Leg numbness 08/12/2017  . Hypermobility syndrome 08/12/2017  . Flu-like symptoms 12/23/2016  . Sinusitis, acute 10/11/2014  . Routine general medical examination at a health care facility 08/28/2014  . Knee pain, bilateral 08/28/2014  . Bug bite 08/28/2014  . Eustachian tube dysfunction 09/06/2013  . Migraine headache 11/24/2010  . ALLERGIC RHINITIS 11/24/2010  . Asthma 11/24/2010  . Irritable bowel syndrome 11/24/2010  . ALLERGY, INGESTED FOOD, DERMATITIS 11/24/2010    Bess Harvest, PTA 02/26/20 12:03 PM    [facility] [address]  [place], [place], [address] Phone: [phone]   Fax:  [phone]  Name: Christine Morgan MRN: [id number] Date of Birth: 1981-10-10

## 2020-03-02 ENCOUNTER — Ambulatory Visit: Payer: BC Managed Care – PPO | Attending: Internal Medicine

## 2020-03-02 DIAGNOSIS — Z23 Encounter for immunization: Secondary | ICD-10-CM

## 2020-03-02 NOTE — Progress Notes (Signed)
   Covid-19 Vaccination Clinic  Name:  Christine Morgan    MRN: [id number] DOB: 05-18-1981  03/02/2020  [name] was observed post Covid-19 immunization for 15 minutes without incident. She was provided with Vaccine Information Sheet and instruction to access the V-Safe system.   [name] was instructed to call 911 with any severe reactions post vaccine: [name] Difficulty breathing  . Swelling of face and throat  . A fast heartbeat  . A bad rash all over body  . Dizziness and weakness   Immunizations Administered    Name Date Dose VIS Date Route   Pfizer COVID-19 Vaccine 03/02/2020  4:46 PM 0.3 mL 01/03/2019 Intramuscular   Manufacturer: [employer], [employer]   Lot: [id number]   NDC: [address]-4

## 2020-03-04 ENCOUNTER — Ambulatory Visit: Payer: BC Managed Care – PPO

## 2020-03-04 ENCOUNTER — Ambulatory Visit: Payer: BC Managed Care – PPO | Admitting: Physical Therapy

## 2020-03-11 ENCOUNTER — Other Ambulatory Visit: Payer: Self-pay

## 2020-03-11 ENCOUNTER — Ambulatory Visit: Payer: BC Managed Care – PPO | Attending: Family Medicine

## 2020-03-11 DIAGNOSIS — R29818 Other symptoms and signs involving the nervous system: Secondary | ICD-10-CM | POA: Insufficient documentation

## 2020-03-11 DIAGNOSIS — R29898 Other symptoms and signs involving the musculoskeletal system: Secondary | ICD-10-CM | POA: Insufficient documentation

## 2020-03-11 DIAGNOSIS — M6281 Muscle weakness (generalized): Secondary | ICD-10-CM

## 2020-03-11 NOTE — Therapy (Signed)
[facility] [address]  [place], [place], [address] Phone: [phone]   Fax:  [phone]  Physical Therapy Treatment  Patient Details  Name: Christine Morgan MRN: [id number] Date of Birth: April 15, 1981 Referring Provider (PT): Clearance Coots, MD   Encounter Date: 03/11/2020  PT End of Session - 03/11/20 0811    Visit Number  4    Number of Visits  7    Authorization Type  BCBS    PT Start Time  0802    PT Stop Time  0843    PT Time Calculation (min)  41 min    Activity Tolerance  Patient tolerated treatment well    Behavior During Therapy  [facility] for tasks assessed/performed       Past Medical History:  Diagnosis Date  . ALLERGIC RHINITIS   . ALLERGY, INGESTED FOOD, DERMATITIS   . ASTHMA   . Asthma   . Irritable bowel syndrome   . MIGRAINE HEADACHE   . PCOS (polycystic ovarian syndrome)     Past Surgical History:  Procedure Laterality Date  . GYNECOLOGIC CRYOSURGERY      There were no vitals filed for this visit.  Subjective Assessment - 03/11/20 0804    Subjective  Pt. noting 24-36 hours of feeling poorly with low energy and chills following 2nd covid-19 vaccine injection.    Pertinent History  migraines, asthma    Diagnostic tests  none    Patient Stated Goals  "get back to doing sprints and understand what I can and cannot do"    Currently in Pain?  No/denies    Pain Score  0-No pain    Pain Location  Leg    Pain Orientation  Right    Pain Descriptors / Indicators  Tingling;Numbness    Pain Type  Chronic pain    Multiple Pain Sites  No         [facility] PT Assessment - 03/11/20 0001      AROM   Right Ankle Dorsiflexion  10    Left Ankle Dorsiflexion  12                   [facility] Adult PT Treatment/Exercise - 03/11/20 0001      Knee/Hip Exercises: Stretches   Sports administrator  Right;Left;30 seconds;1 rep    Sports administrator Limitations  prone with strap     ITB Stretch  Right;Left;1  rep;30 seconds    ITB Stretch Limitations  B sidelying     Piriformis Stretch  Right;Left;1 rep;30 seconds    Piriformis Stretch Limitations  sitting fig 4 stretch    Gastroc Stretch  Right;Left;30 seconds;2 reps    Gastroc Stretch Limitations  runners stretch and foot on wall        Knee/Hip Exercises: Aerobic   Recumbent Bike  L1 x 55mn      Knee/Hip Exercises: Standing   SLS  B SLS x 30 sec    SLS with Vectors  B SLS + cone toe-touch to cones 2 x 20 sec       Knee/Hip Exercises: Supine   Bridges with Clamshell  Both;15 reps;Strengthening      Knee/Hip Exercises: Sidelying   Clams  x15 with red TB each side             PT Education - 03/11/20 0848    Education Details  HEP update;  ankle "clocks, sidelying ITB stretch    Person(s) Educated  Patient  Methods  Explanation;Demonstration;Verbal cues;Handout    Comprehension  Verbalized understanding;Returned demonstration;Verbal cues required       PT Short Term Goals - 02/26/20 0815      PT SHORT TERM GOAL #1   Title  Patient to be independent with initial HEP.    Time  3    Period  Weeks    Status  Achieved    Target Date  03/07/20        PT Long Term Goals - 03/11/20 0818      PT LONG TERM GOAL #1   Title  Patient to be independent with advanced HEP.    Time  6    Period  Weeks    Status  On-going      PT LONG TERM GOAL #2   Title  Patient to demonstrate B LE strength >/=4+/5.    Time  6    Period  Weeks    Status  On-going      PT LONG TERM GOAL #3   Title  Patient to demonstrate B ankle dorsiflexion 10 degrees R LE, 15 degrees L LE.    Time  6    Period  Weeks    Status  Partially Met   03/11/20:  able to demo L ankle 12dg, R ankle 10 dg DF     PT LONG TERM GOAL #4   Title  Patient to report tolerance for running 1 mile without c/o N/T.    Time  6    Period  Weeks    Status  On-going   03/11/20: has not yet attempted     PT LONG TERM GOAL #5   Title  Patient to demonstrate B SLS on foam  for 30 sec with mild sway to improve ankle stability.    Time  6    Period  Weeks    Status  On-going            Plan - 03/11/20 0827    Clinical Impression Statement  Pt. reports feeling chills and fatigue after 2nd covid-19 vaccine injection on Saturday - Felt like this subsided after a day.  Pt. able to demo L ankle DF 12 dg, R ankle DF 10 dg.  Notes consistent calf stretching along with full HEP performance daily.  LTG #3 partially achieved.  Progressed B ankle stability and ROM activities today and updated HEP accordingly.  Pt. only complaint today with SLS activities was L ankle fatigue which subsided with rest.  Ended visit pain free.    Comorbidities  migraines, asthma    Rehab Potential  Good    PT Frequency  1x / week    PT Treatment/Interventions  ADLs/Self Care Home Management;Cryotherapy;Electrical Stimulation;Moist Heat;Balance training;Therapeutic exercise;Therapeutic activities;Functional mobility training;Stair training;Gait training;Ultrasound;Neuromuscular re-education;Patient/family education;Manual techniques;Vasopneumatic Device;Taping;Energy conservation;Dry needling;Passive range of motion    PT Next Visit Plan  ankle and hip stability training, STM to gastrocs/glutes, piriformis and gastroc stretching    Consulted and Agree with Plan of Care  Patient       Patient will benefit from skilled therapeutic intervention in order to improve the following deficits and impairments:  Decreased activity tolerance, Decreased strength, Decreased balance, Increased muscle spasms, Improper body mechanics, Decreased range of motion, Hypermobility, Impaired flexibility, Postural dysfunction  Visit Diagnosis: Other symptoms and signs involving the nervous system  Other symptoms and signs involving the musculoskeletal system  Muscle weakness (generalized)     Problem List Patient Active Problem List   Diagnosis Date Noted  .  Vitamin D deficiency 12/22/2018  . Leg numbness  08/12/2017  . Hypermobility syndrome 08/12/2017  . Flu-like symptoms 12/23/2016  . Sinusitis, acute 10/11/2014  . Routine general medical examination at a health care facility 08/28/2014  . Knee pain, bilateral 08/28/2014  . Bug bite 08/28/2014  . Eustachian tube dysfunction 09/06/2013  . Migraine headache 11/24/2010  . ALLERGIC RHINITIS 11/24/2010  . Asthma 11/24/2010  . Irritable bowel syndrome 11/24/2010  . ALLERGY, INGESTED FOOD, DERMATITIS 11/24/2010    Bess Harvest, PTA 03/11/20 10:54 AM   [facility] [address]  [place], [place], [address] Phone: [phone]   Fax:  [phone]  Name: Christine Morgan MRN: [id number] Date of Birth: 1981/06/28

## 2020-03-18 ENCOUNTER — Ambulatory Visit: Payer: BC Managed Care – PPO

## 2020-03-18 ENCOUNTER — Other Ambulatory Visit: Payer: Self-pay

## 2020-03-18 DIAGNOSIS — R29818 Other symptoms and signs involving the nervous system: Secondary | ICD-10-CM | POA: Diagnosis not present

## 2020-03-18 DIAGNOSIS — M6281 Muscle weakness (generalized): Secondary | ICD-10-CM | POA: Diagnosis not present

## 2020-03-18 DIAGNOSIS — R29898 Other symptoms and signs involving the musculoskeletal system: Secondary | ICD-10-CM

## 2020-03-18 NOTE — Therapy (Signed)
[facility] [address]  [place], [place], [address] Phone: [phone]   Fax:  [phone]  Physical Therapy Treatment  Patient Details  Name: Christine Morgan MRN: [id number] Date of Birth: [birth year] Referring Provider (PT): Clearance Coots, MD   Encounter Date: 03/18/2020  PT End of Session - 03/18/20 0823    Visit Number  5    Number of Visits  7    Authorization Type  BCBS    PT Start Time  0804    PT Stop Time  0844    PT Time Calculation (min)  40 min    Activity Tolerance  Patient tolerated treatment well    Behavior During Therapy  [facility] for tasks assessed/performed       Past Medical History:  Diagnosis Date  . ALLERGIC RHINITIS   . ALLERGY, INGESTED FOOD, DERMATITIS   . ASTHMA   . Asthma   . Irritable bowel syndrome   . MIGRAINE HEADACHE   . PCOS (polycystic ovarian syndrome)     Past Surgical History:  Procedure Laterality Date  . GYNECOLOGIC CRYOSURGERY      There were no vitals filed for this visit.  Subjective Assessment - 03/18/20 0807    Subjective  Pt. reporting allergy flare-up however no other complaints.  Feels this allergy flare-up after eating fresh strawberries yesterday.    Pertinent History  migraines, asthma    Diagnostic tests  none    Patient Stated Goals  "get back to doing sprints and understand what I can and cannot do"    Currently in Pain?  No/denies    Pain Score  0-No pain    Pain Location  Leg    Pain Orientation  Right    Multiple Pain Sites  No                       [facility] Adult PT Treatment/Exercise - 03/18/20 0001      Knee/Hip Exercises: Stretches   Piriformis Stretch  Right;Left;30 seconds;2 reps    Piriformis Stretch Limitations  sitting fig 4 stretch    Gastroc Stretch  Right;Left;30 seconds;2 reps    Gastroc Stretch Limitations  runners stretch and prostretch      Knee/Hip Exercises: Aerobic   Recumbent Bike  L2 x 38mn      Knee/Hip Exercises: Standing   Hip Flexion  Right;Left;10 reps;Knee straight    Hip Flexion Limitations  SLS + yellow TB at ankle; 2 ski poles     Hip ADduction  Right;Left;10 reps;Strengthening    Hip ADduction Limitations  SLS + yellow TB at ankle; 2 ski poles     Hip Abduction  Right;Left;10 reps;Knee straight;Stengthening    Abduction Limitations  SLS + yellow TB at ankle; 2 ski poles    Hip Extension  Right;Left;10 reps;Knee straight    Extension Limitations  SLS + yellow TB at ankle; 2 ski poles     Forward Step Up  Right;Left;10 reps;1 set;Step Height: 4";Hand Hold: 2    Forward Step Up Limitations  at machine              PT Education - 03/18/20 1009    Education Details  HEP update;  4-direction hip kicker with yellow TB issued to pt.    Person(s) Educated  Patient    Methods  Explanation;Demonstration;Verbal cues;Handout    Comprehension  Verbalized understanding;Returned demonstration;Verbal cues required       PT Short  Term Goals - 02/26/20 0815      PT SHORT TERM GOAL #1   Title  Patient to be independent with initial HEP.    Time  3    Period  Weeks    Status  Achieved    Target Date  03/07/20        PT Long Term Goals - 03/11/20 0818      PT LONG TERM GOAL #1   Title  Patient to be independent with advanced HEP.    Time  6    Period  Weeks    Status  On-going      PT LONG TERM GOAL #2   Title  Patient to demonstrate B LE strength >/=4+/5.    Time  6    Period  Weeks    Status  On-going      PT LONG TERM GOAL #3   Title  Patient to demonstrate B ankle dorsiflexion 10 degrees R LE, 15 degrees L LE.    Time  6    Period  Weeks    Status  Partially Met   03/11/20:  able to demo L ankle 12dg, R ankle 10 dg DF     PT LONG TERM GOAL #4   Title  Patient to report tolerance for running 1 mile without c/o N/T.    Time  6    Period  Weeks    Status  On-going   03/11/20: has not yet attempted     PT LONG TERM GOAL #5   Title  Patient to  demonstrate B SLS on foam for 30 sec with mild sway to improve ankle stability.    Time  6    Period  Weeks    Status  On-going            Plan - 03/18/20 0810    Clinical Impression Statement  Pt. reports she has not had numbness and tingling in ~ 3 weeks.  Has not tried running for two weeks.  Does note B ankles (L>R) fatigue and soreness at times with HEP however has been performing ankle "clocks" HEP update.  Pt. able to tolerate continued SLS 4-dir yellow TB kickers today well which was a progression of her dynamic SLS activities.  Notes proximal hip fatigue quickly with these activities thus updated HEP for improved proximal hip strength and dynamic ankle stability for carryover with running.    Comorbidities  migraines, asthma    Rehab Potential  Good    PT Frequency  1x / week    PT Treatment/Interventions  ADLs/Self Care Home Management;Cryotherapy;Electrical Stimulation;Moist Heat;Balance training;Therapeutic exercise;Therapeutic activities;Functional mobility training;Stair training;Gait training;Ultrasound;Neuromuscular re-education;Patient/family education;Manual techniques;Vasopneumatic Device;Taping;Energy conservation;Dry needling;Passive range of motion    PT Next Visit Plan  ankle and hip stability training, STM to gastrocs/glutes, piriformis and gastroc stretching    Consulted and Agree with Plan of Care  Patient       Patient will benefit from skilled therapeutic intervention in order to improve the following deficits and impairments:  Decreased activity tolerance, Decreased strength, Decreased balance, Increased muscle spasms, Improper body mechanics, Decreased range of motion, Hypermobility, Impaired flexibility, Postural dysfunction  Visit Diagnosis: Other symptoms and signs involving the nervous system  Other symptoms and signs involving the musculoskeletal system  Muscle weakness (generalized)     Problem List Patient Active Problem List   Diagnosis Date  Noted  . Vitamin D deficiency 12/22/2018  . Leg numbness 08/12/2017  . Hypermobility syndrome 08/12/2017  .  Flu-like symptoms 12/23/2016  . Sinusitis, acute 10/11/2014  . Routine general medical examination at a health care facility 08/28/2014  . Knee pain, bilateral 08/28/2014  . Bug bite 08/28/2014  . Eustachian tube dysfunction 09/06/2013  . Migraine headache 11/24/2010  . ALLERGIC RHINITIS 11/24/2010  . Asthma 11/24/2010  . Irritable bowel syndrome 11/24/2010  . ALLERGY, INGESTED FOOD, DERMATITIS 11/24/2010    Bess Harvest, PTA 03/18/20 2:46 PM   [facility] [address]  [place], [place], [address] Phone: [phone]   Fax:  [phone]  Name: Christine Morgan MRN: [id number] Date of Birth: 23-Sep-1981

## 2020-03-25 ENCOUNTER — Encounter: Payer: Self-pay | Admitting: Physical Therapy

## 2020-03-25 ENCOUNTER — Other Ambulatory Visit: Payer: Self-pay

## 2020-03-25 ENCOUNTER — Ambulatory Visit: Payer: BC Managed Care – PPO | Admitting: Physical Therapy

## 2020-03-25 DIAGNOSIS — M6281 Muscle weakness (generalized): Secondary | ICD-10-CM

## 2020-03-25 DIAGNOSIS — R29898 Other symptoms and signs involving the musculoskeletal system: Secondary | ICD-10-CM | POA: Diagnosis not present

## 2020-03-25 DIAGNOSIS — R29818 Other symptoms and signs involving the nervous system: Secondary | ICD-10-CM | POA: Diagnosis not present

## 2020-03-25 NOTE — Therapy (Addendum)
[facility] [address]  [place], [place], [address] Phone: [phone]   Fax:  [phone]  Physical Therapy Treatment  Patient Details  Name: Christine Morgan MRN: [id number] Date of Birth: 22-Nov-1980 Referring Provider (PT): Clearance Coots, MD   Encounter Date: 03/25/2020  PT End of Session - 03/25/20 0942    Visit Number  6    Number of Visits  7    Authorization Type  BCBS    PT Start Time  0802    PT Stop Time  0843    PT Time Calculation (min)  41 min    Activity Tolerance  Patient tolerated treatment well    Behavior During Therapy  [facility] for tasks assessed/performed       Past Medical History:  Diagnosis Date  . ALLERGIC RHINITIS   . ALLERGY, INGESTED FOOD, DERMATITIS   . ASTHMA   . Asthma   . Irritable bowel syndrome   . MIGRAINE HEADACHE   . PCOS (polycystic ovarian syndrome)     Past Surgical History:  Procedure Laterality Date  . GYNECOLOGIC CRYOSURGERY      There were no vitals filed for this visit.  Subjective Assessment - 03/25/20 0803    Subjective  Thinks that she has been improving. Ran for the first time so she is sore. Denies N/T in the LEs while running but her leg felt "heavier." Would like to go on a 30 day hold today. Reports 80-90% improvement.    Diagnostic tests  none    Patient Stated Goals  "get back to doing sprints and understand what I can and cannot do"    Currently in Pain?  No/denies         [facility] PT Assessment - 03/25/20 0001      AROM   Right Ankle Dorsiflexion  10    Left Ankle Dorsiflexion  14      Strength   Right Hip Flexion  4+/5    Right Hip Extension  4/5    Right Hip ABduction  4+/5    Right Hip ADduction  4+/5    Left Hip Flexion  4+/5    Left Hip Extension  4+/5    Left Hip ABduction  4+/5    Left Hip ADduction  4+/5    Right Knee Flexion  4+/5    Right Knee Extension  4+/5    Left Knee Flexion  4+/5    Left Knee Extension  4+/5    Right  Ankle Dorsiflexion  4/5    Right Ankle Plantar Flexion  4+/5   compensating with bent knee   Right Ankle Inversion  4+/5    Right Ankle Eversion  4+/5    Left Ankle Dorsiflexion  4+/5    Left Ankle Plantar Flexion  5/5    Left Ankle Inversion  3+/5    Left Ankle Eversion  3+/5                    [facility] Adult PT Treatment/Exercise - 03/25/20 0001      Knee/Hip Exercises: Stretches   Other Knee/Hip Stretches  modified R/L hip ER stretch 30"      Knee/Hip Exercises: Aerobic   Recumbent Bike  L2 x 64mn      Knee/Hip Exercises: Supine   Bridges  Strengthening;Right;1 set;10 reps    Bridges Limitations  single leg bridge      Ankle Exercises: SClinical research associate  1 rep;30 seconds    Gastroc Stretch Limitations  each LE; toes on wall      Ankle Exercises: Standing   Other Standing Ankle Exercises  R/L SLS 30" each   moderate ankle instability            PT Education - 03/25/20 0941    Education Details  discussion on objective progress and remaining impairments; update/consolidation of HEP    Person(s) Educated  Patient    Methods  Explanation;Demonstration;Tactile cues;Verbal cues;Handout    Comprehension  Verbalized understanding;Returned demonstration       PT Short Term Goals - 03/25/20 1610      PT SHORT TERM GOAL #1   Title  Patient to be independent with initial HEP.    Time  3    Period  Weeks    Status  Achieved    Target Date  03/07/20        PT Long Term Goals - 03/25/20 0808      PT LONG TERM GOAL #1   Title  Patient to be independent with advanced HEP.    Time  6    Period  Weeks    Status  Achieved      PT LONG TERM GOAL #2   Title  Patient to demonstrate B LE strength >/=4+/5.    Time  6    Period  Weeks    Status  Partially Met   revealed improvement in B hip abduction/adduction, R knee flexion, B plantarflexion, and L dorsiflexion strength     PT LONG TERM GOAL #3   Title  Patient to demonstrate B ankle  dorsiflexion 10 degrees R LE, 15 degrees L LE.    Time  6    Period  Weeks    Status  Partially Met   0517/21:  able to demo L ankle 14dg, R ankle 10 dg DF     PT LONG TERM GOAL #4   Title  Patient to report tolerance for running 1 mile without c/o N/T.    Time  6    Period  Weeks    Status  Achieved   able to complete 1.25 miles     PT LONG TERM GOAL #5   Title  Patient to demonstrate B SLS on foam for 30 sec with mild sway to improve ankle stability.    Time  6    Period  Weeks    Status  Partially Met   able to complete but with moderate ankle instability, worse R vs. L           Plan - 03/25/20 0949    Clinical Impression Statement  Patient reporting 80-90% improvement with therapy at this time. Notes that she feels comfortable wrapping up with therapy and is requesting a 30 day hold. Patient was able to complete a 1.25 mile run without c/o N/T in the LEs but with report of "heaviness" in the R LE. Patient has met or partially met all goals at this time. Strength testing revealed improvement in B hip abduction/adduction, R knee flexion, B plantarflexion, and L dorsiflexion strength. Patient has also demonstrated considerable improvement in B ankle dorsiflexion AROM, with R still most limiting. Single leg stability has improved as patient able to complete 30 sec SLS without LOB, but still with moderate able instability remaining. Worked on LE strengthening ther-ex to address patient's remaining impairments. Patient tolerated these well, thus they were added into HEP. Patient reported understanding and without complaints at  end of session. Patient being placed on 30 day hold d/t satisfaction with CLOF.    Comorbidities  migraines, asthma    Rehab Potential  Good    PT Frequency  1x / week    PT Treatment/Interventions  ADLs/Self Care Home Management;Cryotherapy;Electrical Stimulation;Moist Heat;Balance training;Therapeutic exercise;Therapeutic activities;Functional mobility  training;Stair training;Gait training;Ultrasound;Neuromuscular re-education;Patient/family education;Manual techniques;Vasopneumatic Device;Taping;Energy conservation;Dry needling;Passive range of motion    PT Next Visit Plan  ankle and hip stability training, STM to gastrocs/glutes, piriformis and gastroc stretching    Consulted and Agree with Plan of Care  Patient       Patient will benefit from skilled therapeutic intervention in order to improve the following deficits and impairments:  Decreased activity tolerance, Decreased strength, Decreased balance, Increased muscle spasms, Improper body mechanics, Decreased range of motion, Hypermobility, Impaired flexibility, Postural dysfunction  Visit Diagnosis: Other symptoms and signs involving the nervous system  Other symptoms and signs involving the musculoskeletal system  Muscle weakness (generalized)     Problem List Patient Active Problem List   Diagnosis Date Noted  . Vitamin D deficiency 12/22/2018  . Leg numbness 08/12/2017  . Hypermobility syndrome 08/12/2017  . Flu-like symptoms 12/23/2016  . Sinusitis, acute 10/11/2014  . Routine general medical examination at a health care facility 08/28/2014  . Knee pain, bilateral 08/28/2014  . Bug bite 08/28/2014  . Eustachian tube dysfunction 09/06/2013  . Migraine headache 11/24/2010  . ALLERGIC RHINITIS 11/24/2010  . Asthma 11/24/2010  . Irritable bowel syndrome 11/24/2010  . ALLERGY, INGESTED FOOD, DERMATITIS 11/24/2010     Janene Harvey, PT, DPT 03/25/20 9:51 AM   [facility] [address]  [place], [place], [address] Phone: [phone]   Fax:  [phone]  Name: Christine Morgan MRN: [id number] Date of Birth: 1981/05/20  PHYSICAL THERAPY DISCHARGE SUMMARY  Visits from Start of Care: 6  Current functional level related to goals / functional outcomes: See above clinical impression; patient did  not return during 30 day hold period   Remaining deficits: Ankle instability, weakness, decreased ROM   Education / Equipment: HEP  Plan: Patient agrees to discharge.  Patient goals were partially met. Patient is being discharged due to being pleased with the current functional level.  ?????     Janene Harvey, PT, DPT 04/24/20 2:07 PM

## 2020-08-20 ENCOUNTER — Encounter: Payer: Self-pay | Admitting: Family Medicine

## 2020-08-21 ENCOUNTER — Other Ambulatory Visit: Payer: Self-pay | Admitting: Family Medicine

## 2020-08-21 DIAGNOSIS — L989 Disorder of the skin and subcutaneous tissue, unspecified: Secondary | ICD-10-CM

## 2020-08-21 NOTE — Progress Notes (Signed)
re

## 2020-08-22 ENCOUNTER — Telehealth (INDEPENDENT_AMBULATORY_CARE_PROVIDER_SITE_OTHER): Payer: BC Managed Care – PPO | Admitting: Family Medicine

## 2020-08-22 ENCOUNTER — Other Ambulatory Visit: Payer: Self-pay

## 2020-08-22 DIAGNOSIS — R200 Anesthesia of skin: Secondary | ICD-10-CM

## 2020-08-22 NOTE — Progress Notes (Signed)
Virtual Visit via Telephone Note  I connected with [facility] on 08/22/20 at  2:30 PM EDT by telephone and verified that I am speaking with the correct person using two identifiers.   I discussed the limitations, risks, security and privacy concerns of performing an evaluation and management service by telephone and the availability of in person appointments. I also discussed with the patient that there may be a patient responsible charge related to this service. The patient expressed understanding and agreed to proceed.  Patient: home  Physician: office   History of Present Illness:  Ms. Kapaun is following up for her altered sensation in her lower legs. Is been ongoing for over a year. She went through physical therapy and did notice some improvement. She still has more constant sensational changes on some of her toes. She has been able to get back to running.   Observations/Objective:   Assessment and Plan:  Leg Numbness:  Symptoms are still ongoing. Has had some improvement with physical therapy. -Counseled on supportive care. -Referral for nerve study.  Follow Up Instructions:    I discussed the assessment and treatment plan with the patient. The patient was provided an opportunity to ask questions and all were answered. The patient agreed with the plan and demonstrated an understanding of the instructions.   The patient was advised to call back or seek an in-person evaluation if the symptoms worsen or if the condition fails to improve as anticipated.  I provided 8 minutes of non-face-to-face time during this encounter.   Clare Gandy, MD

## 2020-08-22 NOTE — Assessment & Plan Note (Signed)
Symptoms are still ongoing. Has had some improvement with physical therapy. -Counseled on supportive care. -Referral for nerve study.

## 2020-09-27 ENCOUNTER — Other Ambulatory Visit: Payer: Self-pay

## 2020-09-27 ENCOUNTER — Encounter: Payer: Self-pay | Admitting: Family Medicine

## 2020-09-27 ENCOUNTER — Ambulatory Visit (INDEPENDENT_AMBULATORY_CARE_PROVIDER_SITE_OTHER): Payer: BC Managed Care – PPO | Admitting: Family Medicine

## 2020-09-27 VITALS — BP 108/62 | HR 77 | Temp 98.4°F | Ht 64.5 in | Wt 163.5 lb

## 2020-09-27 DIAGNOSIS — Z13220 Encounter for screening for lipoid disorders: Secondary | ICD-10-CM | POA: Diagnosis not present

## 2020-09-27 DIAGNOSIS — R5383 Other fatigue: Secondary | ICD-10-CM

## 2020-09-27 DIAGNOSIS — G43809 Other migraine, not intractable, without status migrainosus: Secondary | ICD-10-CM | POA: Diagnosis not present

## 2020-09-27 DIAGNOSIS — J4520 Mild intermittent asthma, uncomplicated: Secondary | ICD-10-CM

## 2020-09-27 DIAGNOSIS — Z0000 Encounter for general adult medical examination without abnormal findings: Secondary | ICD-10-CM

## 2020-09-27 LAB — COMPREHENSIVE METABOLIC PANEL
ALT: 12 U/L (ref 0–35)
AST: 15 U/L (ref 0–37)
Albumin: 4 g/dL (ref 3.5–5.2)
Alkaline Phosphatase: 77 U/L (ref 39–117)
BUN: 10 mg/dL (ref 6–23)
CO2: 28 mEq/L (ref 19–32)
Calcium: 8.9 mg/dL (ref 8.4–10.5)
Chloride: 105 mEq/L (ref 96–112)
Creatinine, Ser: 0.91 mg/dL (ref 0.40–1.20)
GFR: 80.78 mL/min (ref >60.00)
Glucose, Bld: 78 mg/dL (ref 70–99)
Potassium: 4.2 mEq/L (ref 3.5–5.1)
Sodium: 139 mEq/L (ref 135–145)
Total Bilirubin: 0.3 mg/dL (ref 0.2–1.2)
Total Protein: 7 g/dL (ref 6.0–8.3)

## 2020-09-27 LAB — CBC
HCT: 39.3 % (ref 36.0–46.0)
Hemoglobin: 13.4 g/dL (ref 12.0–15.0)
MCHC: 33.9 g/dL (ref 30.0–36.0)
MCV: 92.5 fl (ref 78.0–100.0)
Platelets: 265 10*3/uL (ref 150.0–400.0)
RBC: 4.25 Mil/uL (ref 3.87–5.11)
RDW: 13.3 % (ref 11.5–15.5)
WBC: 6.8 10*3/uL (ref 4.0–10.5)

## 2020-09-27 LAB — LIPID PANEL
Cholesterol: 137 mg/dL (ref 0–200)
HDL: 49.7 mg/dL (ref >39.00)
LDL Cholesterol: 63 mg/dL (ref 0–99)
NonHDL: 87.4
Total CHOL/HDL Ratio: 3
Triglycerides: 120 mg/dL (ref 0.0–149.0)
VLDL: 24 mg/dL (ref 0.0–40.0)

## 2020-09-27 MED ORDER — SUMATRIPTAN SUCCINATE 100 MG PO TABS: ORAL_TABLET | ORAL | 2 refills | Status: DC

## 2020-09-27 MED ORDER — ALBUTEROL SULFATE HFA 108 (90 BASE) MCG/ACT IN AERS: 2.0000 | INHALATION_SPRAY | Freq: Four times a day (QID) | RESPIRATORY_TRACT | 2 refills | Status: DC | PRN

## 2020-09-27 NOTE — Patient Instructions (Addendum)
Give [place] 2-3 business days to get the results of your labs back.   Keep the diet clean and stay active.  Check on your appointment for neurology.  Let us know if you need anything.

## 2020-09-27 NOTE — Progress Notes (Signed)
Chief Complaint  Patient presents with  . Annual Exam     [name] is here for a complete physical.   Her last physical was >1 year ago.  Current diet: in general, a "healthy" diet. Current exercise: HIIT, wt resistance exercise. Contraception? Yes Fatigue out of ordinary? No Seatbelt? Yes  Health Maintenance Pap/HPV- Yes Tetanus- Yes HIV screening- Yes Hep C screening- No  Past Medical History:  Diagnosis Date  . ALLERGIC RHINITIS   . ALLERGY, INGESTED FOOD, DERMATITIS   . ASTHMA   . Asthma   . Irritable bowel syndrome   . MIGRAINE HEADACHE   . PCOS (polycystic ovarian syndrome)      Past Surgical History:  Procedure Laterality Date  . GYNECOLOGIC CRYOSURGERY      Medications  Current Outpatient Medications on File Prior to Visit  Medication Sig Dispense Refill  . albuterol (VENTOLIN HFA) 108 (90 Base) MCG/ACT inhaler Inhale 2 puffs into the lungs every 4 (four) hours as needed for wheezing or shortness of breath. 18 g 3  . Ascorbic Acid (VITAMIN C) 100 MG tablet Take 100 mg by mouth daily.    . Biotin 1 MG CAPS Take by mouth.    [name] [name] 0.35 MG tablet TK 1 T PO D  6  . cyanocobalamin 100 MCG tablet Take 100 mcg by mouth daily.    [name] ibuprofen (ADVIL,MOTRIN) 200 MG tablet Take 200 mg by mouth every 6 (six) hours as needed.      . Prenatal Vit-Fe Fumarate-FA (PRENATAL MULTIVITAMIN) TABS tablet Take 1 tablet by mouth daily at 12 noon.    . SUMAtriptan (IMITREX) 100 MG tablet TAKE 1 TABLET ONCE AS      NEEDED 9 tablet 2    Allergies Allergies  Allergen Reactions  . Penicillins Rash    Review of Systems: Constitutional:  no unexpected weight changes Eye:  no recent significant change in vision Ear/Nose/Mouth/Throat:  Ears:  no tinnitus or vertigo and no recent change in hearing Nose/Mouth/Throat:  no complaints of nasal congestion, no sore throat Cardiovascular: no chest pain Respiratory:  no cough and no shortness of  breath Gastrointestinal:  no abdominal pain, no change in bowel habits GU:  Female: negative for dysuria or pelvic pain Musculoskeletal/Extremities:  no pain of the joints Integumentary (Skin/Breast):  no abnormal skin lesions reported Neurologic:  No current headaches Endocrine:  denies fatigue Hematologic/Lymphatic:  No areas of easy bleeding  Exam BP 108/62 (BP Location: Left Arm, Patient Position: Sitting, Cuff Size: Normal)   Pulse 77   Temp 98.4 F (36.9 C) (Oral)   Ht 5' 4.5" (1.638 m)   Wt 163 lb 8 oz (74.2 kg)   SpO2 98%   BMI 27.63 kg/m  General:  well developed, well nourished, in no apparent distress Skin:  no significant moles, warts, or growths Head:  no masses, lesions, or tenderness Eyes:  pupils equal and round, sclera anicteric without injection Ears:  canals without lesions, TMs shiny without retraction, no obvious effusion, no erythema Nose:  nares patent, septum midline, mucosa normal, and no drainage or sinus tenderness Throat/Pharynx:  lips and gingiva without lesion; tongue and uvula midline; non-inflamed pharynx; no exudates or postnasal drainage Neck: neck supple without adenopathy, thyromegaly, or masses Lungs:  clear to auscultation, breath sounds equal bilaterally, no respiratory distress Cardio:  regular rate and rhythm, no bruits, no LE edema Abdomen:  abdomen soft, nontender; bowel sounds normal; no masses or organomegaly Genital: Defer to GYN Musculoskeletal:  symmetrical muscle groups noted without atrophy or deformity Extremities:  no clubbing, cyanosis, or edema, no deformities, no skin discoloration Neuro:  gait normal; deep tendon reflexes normal and symmetric Psych: well oriented with normal range of affect and appropriate judgment/insight  Assessment and Plan  Well adult exam  Fatigue, unspecified type - Plan: CBC, Comprehensive metabolic panel  Screening cholesterol level - Plan: Lipid panel  Other migraine without status  migrainosus, not intractable - Plan: SUMAtriptan (IMITREX) 100 MG tablet  Mild intermittent asthma without complication - Plan: albuterol (VENTOLIN HFA) 108 (90 Base) MCG/ACT inhaler   Well 39 y.o. female. Counseled on diet and exercise. Other orders as above. Follow up in 1 yr or prn. The patient voiced understanding and agreement to the plan.  Christine Morgan [place], DO 09/27/20 1:38 PM

## 2020-10-30 ENCOUNTER — Encounter: Payer: Self-pay | Admitting: Neurology

## 2020-10-30 ENCOUNTER — Ambulatory Visit: Payer: BC Managed Care – PPO | Admitting: Neurology

## 2020-10-30 VITALS — BP 118/72 | HR 89 | Ht 64.0 in | Wt 166.4 lb

## 2020-10-30 DIAGNOSIS — M79674 Pain in right toe(s): Secondary | ICD-10-CM | POA: Diagnosis not present

## 2020-10-30 DIAGNOSIS — G5731 Lesion of lateral popliteal nerve, right lower limb: Secondary | ICD-10-CM | POA: Diagnosis not present

## 2020-10-30 NOTE — Progress Notes (Signed)
[facility]    Provider:  Dr Lucia Gaskins Requesting Provider: Myra Rude, MD Primary Care Provider:  Sharlene Dory, DO  CC:  Right leg and toe numbness/sensory changes  HPI:  Christine Morgan is a 39 y.o. female here as requested by Myra Rude, MD for leg numbness.  I reviewed Dr. Cyndia Diver notes, patient has altered sensation in her lower leg, is been going on for over a year, she initially reported it to Dr. Noreene Filbert at the end of 2020, been present for a couple years, she had been previously seen by himself who thought most of her symptoms were related to hypermobility, she had limited improvement, had not had any imaging or blood work, symptoms were worsening, unable to run long distances, symptoms seem to be occurring in the posterior aspect of both legs, she noticed that is them whenever she is running, no weakness, no atrophy, no medications.  She used ot be an avid 5k runner symptoms none prior to 2017, but symptoms started and has affected her running, she decided to take a break because her knees were hurting, It was mostly knee pain, aches and pain, she reduced her running, bought better shoes, around 2019 she started running again and she noticed when she would run for a mile or over a certain amout of time she would get prickly numb starting at the knee, she doesn't remember how it radiates but it gets to the top of the foot around the top of the toes. She had no back pain. The symptoms would stop if she was not running. She cut back on running, she did different things, then she started noticing happening more even when not running. Dr. 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 She went to physical therapy. She has tried more warm ups, heat, PT has helped. She woke up on morning and she had a sharp pain in the joint in the big  toe, excruciating pain, thr right foot big toe is numb. The low back never really hurted.   she went to physical therapy and did notice some changes, but she still has more constant sensational changes on some of her toes, she has been able to get back to running.  Reviewed notes, labs and imaging from outside physicians, which showed:  See above  Cbc/cmp 09/27/2020 normal  Review of Systems: Patient complains of symptoms per HPI as well as the following symptoms: numbness. Pertinent negatives and positives per HPI. All others negative.   Social History   Socioeconomic History  . Marital status: Single    Spouse name: Not on file  . Number of children: 0  . Years of education: 59  . Highest education level: Not on file  Occupational History  . Occupation: Chief Financial Officer  Tobacco Use  . Smoking status: Never Smoker  . Smokeless tobacco: Never Used  . Tobacco comment: Single-has boyfriend. Employed Architectural technologist at [employer]  . Vaping Use: Never used  Substance and Sexual Activity  . Alcohol use: Yes    Comment: Occ.  . Drug use: No  . Sexual activity: Yes    Birth control/protection: Pill  Other Topics Concern  . Not on file  Social History Narrative   Denies abuse and feels safe at home.      Lives alone   Right handed   Caffeine:  1 cup/day   Social Determinants of Corporate investment banker Strain: Not on file  Food Insecurity: Not on file  Transportation Needs: Not on file  Physical Activity: Not on file  Stress: Not on file  Social Connections: Not on file  Intimate Partner Violence: Not on file    Family History  Problem Relation Age of Onset  . Arthritis Mother   . Eczema Mother   . Hyperlipidemia Father   . Hypertension Father   . Arthritis Maternal Grandmother   . Cancer Maternal Grandmother   . Allergic rhinitis Maternal Grandmother   . Asthma Maternal Grandmother   . Arthritis Paternal Grandmother   . Heart disease  Paternal Grandmother   . Liver disease Paternal Grandfather   . Eczema Sister   . Asthma Sister   . Angioedema Neg Hx   . Immunodeficiency Neg Hx   . Urticaria Neg Hx     Past Medical History:  Diagnosis Date  . ALLERGIC RHINITIS   . ALLERGY, INGESTED FOOD, DERMATITIS   . ASTHMA   . Asthma   . Irritable bowel syndrome   . MIGRAINE HEADACHE   . PCOS (polycystic ovarian syndrome)     Patient Active Problem List   Diagnosis Date Noted  . Pain in right toe(s) 10/30/2020  . Vitamin D deficiency 12/22/2018  . Leg numbness 08/12/2017  . Hypermobility syndrome 08/12/2017  . Flu-like symptoms 12/23/2016  . Sinusitis, acute 10/11/2014  . Routine general medical examination at a health care facility 08/28/2014  . Knee pain, bilateral 08/28/2014  . Bug bite 08/28/2014  . Eustachian tube dysfunction 09/06/2013  . Migraine headache 11/24/2010  . ALLERGIC RHINITIS 11/24/2010  . Asthma 11/24/2010  . Irritable bowel syndrome 11/24/2010  . ALLERGY, INGESTED FOOD, DERMATITIS 11/24/2010    Past Surgical History:  Procedure Laterality Date  . GYNECOLOGIC CRYOSURGERY      Current Outpatient Medications  Medication Sig Dispense Refill  . albuterol (VENTOLIN HFA) 108 (90 Base) MCG/ACT inhaler Inhale 2 puffs into the lungs every 6 (six) hours as needed for wheezing or shortness of breath. 54 g 2  . Ascorbic Acid (VITAMIN C) 100 MG tablet Take 100 mg by mouth daily.    . Biotin 1 MG CAPS Take by mouth.    [name] [name] 0.35 MG tablet TK 1 T PO D  6  . cyanocobalamin 100 MCG tablet Take 100 mcg by mouth daily.    [name] ibuprofen (ADVIL,MOTRIN) 200 MG tablet Take 200 mg by mouth every 6 (six) hours as needed.    . Prenatal Vit-Fe Fumarate-FA (PRENATAL MULTIVITAMIN) TABS tablet Take 1 tablet by mouth daily at 12 noon.    . SUMAtriptan (IMITREX) 100 MG tablet TAKE 1 TABLET ONCE AS      NEEDED 27 tablet 2   No current facility-administered medications for this visit.    Allergies as of 10/30/2020 -  Review Complete 10/30/2020  Allergen Reaction Noted  . Penicillins Rash     Vitals: BP 118/72   Pulse 89   Ht 5\' 4"  (1.626 m)   Wt 166 lb 6.4 oz (75.5 kg)   BMI 28.56 kg/m  Last Weight:  Wt Readings from Last 1 Encounters:  10/30/20 166 lb 6.4 oz (75.5 kg)   Last Height:   Ht Readings from Last 1 Encounters:  10/30/20 5\' 4"  (1.626 m)     Physical exam: Exam: Gen: NAD, conversant, well nourised, well groomed  CV: RRR, no MRG. No Carotid Bruits. No peripheral edema, warm, nontender Eyes: Conjunctivae clear without exudates or hemorrhage  Neuro: Detailed Neurologic Exam  Speech:    Speech is normal; fluent and spontaneous with normal comprehension.  Cognition:    The patient is oriented to person, place, and time;     recent and remote memory intact;     language fluent;     normal attention, concentration,     fund of knowledge Cranial Nerves:    The pupils are equal, round, and reactive to light. The fundi are normal and spontaneous venous pulsations are present. Visual fields are full to finger confrontation. Extraocular movements are intact. Trigeminal sensation is intact and the muscles of mastication are normal. The face is symmetric. The palate elevates in the midline. Hearing intact. Voice is normal. Shoulder shrug is normal. The tongue has normal motion without fasciculations.   Coordination:    Normal finger to nose and heel to shin. Normal rapid alternating movements.   Gait:    Heel-toe and tandem gait are normal.   Motor Observation:    No asymmetry, no atrophy, and no involuntary movements noted. Tone:    Normal muscle tone.    Posture:    Posture is normal. normal erect    Strength:    Strength is V/V in the upper and lower limbs.      Sensation: intact to LT     Reflex Exam:  DTR's:    Deep tendon reflexes in the upper and lower extremities are normal bilaterally.   Toes:    The toes are downgoing bilaterally.    Clonus:    Clonus is absent.    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 Asked her not to do that for 4-8 weeks and if improved she can cancel the emg/ncs otherwise we will go forward with more testing. Also episode of right toe severe joint pain followed by great toe numbness which would be unusual for neuropathy maybe something else will get an xr of the right great toe.  emg/ncs  Orders Placed This Encounter  Procedures  . DG Toe Great Right  . NCV with EMG(electromyography)     Cc: Myra Rude, MD,  Sharlene Dory, DO  Naomie Dean, MD  [facility] [address] [place], [place] [address]  Phone [phone] Fax [phone]

## 2020-10-30 NOTE — Patient Instructions (Signed)
emg/ncs  Common Peroneal Nerve Entrapment  Common peroneal nerve entrapment is a condition that can make it hard to lift a foot. The condition results from pressure on a nerve in the lower leg called the common peroneal nerve. Your common peroneal nerve provides feeling to your outer lower leg and foot. It also supplies the muscles that move your foot and toes upward and outward. What are the causes? This condition may be caused by:  Sitting cross-legged, squatting, or kneeling for long periods of time.  A hard, direct hit to the side of the lower leg.  Swelling from a knee injury.  A break (fracture) in one of the lower leg bones.  Wearing a boot or cast that ends just below the knee.  A growth or cyst near the nerve. What increases the risk? This condition is more likely to develop in people who play:  Contact sports, such as football or hockey.  Sports where you wear high and stiff boots, such as skiing. What are the signs or symptoms? Symptoms of this condition include:  Trouble lifting your foot up (foot drop).  Tripping often.  Your foot hitting the ground harder than normal as you walk.  Numbness, tingling, or pain in the outside of the knee, outside of the lower leg, and top of the foot.  Sensitivity to pressure on the front or side of the leg. How is this diagnosed? This condition may be diagnosed based on:  Your symptoms.  Your medical history.  A physical exam.  Tests, such as: ? An X-ray to check the bones of your knee and leg. ? MRI to check tendons that attach to the side of your knee. ? An ultrasound to check for a growth or cyst. ? An electromyogram (EMG) to check your nerves. During your physical exam, your health care provider will check for numbness in your leg and test the strength of your lower leg muscles. He or she may tap the side of your lower leg to see if that causes tingling. How is this treated? Treatment for this condition may  include:  Avoiding activities that make symptoms worse.  Using a brace to hold up your foot and toes.  Taking anti-inflammatory pain medicines to relieve swelling and lessen pain.  Having medicines injected into your ankle joint to lessen pain and swelling.  Doing exercises to help you regain or maintain movement (physical therapy).  Surgery to take pressure off the nerve. This may be needed if there is no improvement after 2-3 months or if there is a growth pushing on the nerve.  Returning gradually to full activity. Follow these instructions at home: If you have a brace:  Wear it as told by your health care provider. Remove it only as told by your health care provider.  Loosen the brace if your toes tingle, become numb, or turn cold and blue.  Keep the brace clean.  If the brace is not waterproof: ? Do not let it get wet. ? Cover it with a watertight covering when you take a bath or a shower.  Ask your health care provider when it is safe to drive with a brace on your foot. Activity  Return to your normal activities as told by your health care provider. Ask your health care provider what activities are safe for you.  Do not do any activities that make pain or swelling worse.  Do exercises as told by your health care provider. General instructions  Take over-the-counter and  prescription medicines only as told by your health care provider.  Do not put your full weight on your knee until your health care provider says you can. Use crutches as directed by your health care provider.  Keep all follow-up visits as told by your health care provider. This is important. How is this prevented?  Wear supportive footwear that is appropriate for your athletic activity.  Avoid athletic activities that cause ankle pain or swelling.  Wear protective padding over your lower legs when playing contact sports.  Make sure your boots do not put extra pressure on the area just below your  knees.  Do not sit cross-legged for long periods of time. Contact a health care provider if:  Your symptoms do not get better in 2-3 months.  The weakness or numbness in your leg or foot gets worse. Summary  Common peroneal nerve entrapment is a condition that results from pressure on a nerve in the lower leg called the common peroneal nerve.  This condition may be caused by a hard hit, swelling, a fracture, or a cyst in the lower leg.  Treatment may include rest, a brace, medicines, and physical therapy. Sometimes surgery is needed.  Do not do any activities that make pain or swelling worse. This information is not intended to replace advice given to you by your health care provider. Make sure you discuss any questions you have with your health care provider. Document Revised: 09/05/2018 Document Reviewed: 09/05/2018 Elsevier Patient Education  2020 [employer].

## 2020-10-31 ENCOUNTER — Ambulatory Visit
Admission: RE | Admit: 2020-10-31 | Discharge: 2020-10-31 | Disposition: A | Discharge status: Home / Self Care | Payer: BC Managed Care – PPO | Source: Ambulatory Visit | Attending: Neurology | Admitting: Neurology

## 2020-10-31 DIAGNOSIS — R200 Anesthesia of skin: Secondary | ICD-10-CM | POA: Diagnosis not present

## 2020-10-31 DIAGNOSIS — M79674 Pain in right toe(s): Secondary | ICD-10-CM | POA: Diagnosis not present

## 2020-10-31 DIAGNOSIS — G5731 Lesion of lateral popliteal nerve, right lower limb: Secondary | ICD-10-CM

## 2020-11-04 DIAGNOSIS — Z114 Encounter for screening for human immunodeficiency virus [HIV]: Secondary | ICD-10-CM | POA: Diagnosis not present

## 2020-11-04 DIAGNOSIS — Z309 Encounter for contraceptive management, unspecified: Secondary | ICD-10-CM | POA: Diagnosis not present

## 2020-11-04 DIAGNOSIS — Z01419 Encounter for gynecological examination (general) (routine) without abnormal findings: Secondary | ICD-10-CM | POA: Diagnosis not present

## 2020-11-04 DIAGNOSIS — Z113 Encounter for screening for infections with a predominantly sexual mode of transmission: Secondary | ICD-10-CM | POA: Diagnosis not present

## 2020-11-04 DIAGNOSIS — R87810 Cervical high risk human papillomavirus (HPV) DNA test positive: Secondary | ICD-10-CM | POA: Diagnosis not present

## 2020-11-04 DIAGNOSIS — Z1159 Encounter for screening for other viral diseases: Secondary | ICD-10-CM | POA: Diagnosis not present

## 2020-11-20 DIAGNOSIS — F419 Anxiety disorder, unspecified: Secondary | ICD-10-CM | POA: Diagnosis not present

## 2020-11-28 DIAGNOSIS — L821 Other seborrheic keratosis: Secondary | ICD-10-CM | POA: Diagnosis not present

## 2020-11-28 DIAGNOSIS — D485 Neoplasm of uncertain behavior of skin: Secondary | ICD-10-CM | POA: Diagnosis not present

## 2020-12-04 DIAGNOSIS — F419 Anxiety disorder, unspecified: Secondary | ICD-10-CM | POA: Diagnosis not present

## 2020-12-18 DIAGNOSIS — F419 Anxiety disorder, unspecified: Secondary | ICD-10-CM | POA: Diagnosis not present

## 2020-12-25 DIAGNOSIS — F419 Anxiety disorder, unspecified: Secondary | ICD-10-CM | POA: Diagnosis not present

## 2020-12-26 ENCOUNTER — Encounter: Payer: BC Managed Care – PPO | Admitting: Neurology

## 2021-01-08 DIAGNOSIS — F419 Anxiety disorder, unspecified: Secondary | ICD-10-CM | POA: Diagnosis not present

## 2021-01-15 DIAGNOSIS — F419 Anxiety disorder, unspecified: Secondary | ICD-10-CM | POA: Diagnosis not present

## 2021-01-22 DIAGNOSIS — F419 Anxiety disorder, unspecified: Secondary | ICD-10-CM | POA: Diagnosis not present

## 2021-02-05 DIAGNOSIS — F419 Anxiety disorder, unspecified: Secondary | ICD-10-CM | POA: Diagnosis not present

## 2021-02-12 DIAGNOSIS — F419 Anxiety disorder, unspecified: Secondary | ICD-10-CM | POA: Diagnosis not present

## 2021-02-19 DIAGNOSIS — F419 Anxiety disorder, unspecified: Secondary | ICD-10-CM | POA: Diagnosis not present

## 2021-03-04 DIAGNOSIS — F419 Anxiety disorder, unspecified: Secondary | ICD-10-CM | POA: Diagnosis not present

## 2021-03-12 DIAGNOSIS — F419 Anxiety disorder, unspecified: Secondary | ICD-10-CM | POA: Diagnosis not present

## 2021-03-19 DIAGNOSIS — F419 Anxiety disorder, unspecified: Secondary | ICD-10-CM | POA: Diagnosis not present

## 2021-03-26 DIAGNOSIS — F419 Anxiety disorder, unspecified: Secondary | ICD-10-CM | POA: Diagnosis not present

## 2021-03-28 ENCOUNTER — Encounter: Payer: Self-pay | Admitting: Physician Assistant

## 2021-03-28 ENCOUNTER — Telehealth: Payer: BC Managed Care – PPO | Admitting: Physician Assistant

## 2021-03-28 DIAGNOSIS — B9689 Other specified bacterial agents as the cause of diseases classified elsewhere: Secondary | ICD-10-CM | POA: Diagnosis not present

## 2021-03-28 DIAGNOSIS — J019 Acute sinusitis, unspecified: Secondary | ICD-10-CM

## 2021-03-28 MED ORDER — DOXYCYCLINE HYCLATE 100 MG PO TABS: 100.0000 mg | ORAL_TABLET | Freq: Two times a day (BID) | ORAL | 0 refills | Status: DC

## 2021-03-28 NOTE — Patient Instructions (Signed)
Sinusitis, Adult Sinusitis is inflammation of your sinuses. Sinuses are hollow spaces in the bones around your face. Your sinuses are located:  Around your eyes.  In the middle of your forehead.  Behind your nose.  In your cheekbones. Mucus normally drains out of your sinuses. When your nasal tissues become inflamed or swollen, mucus can become trapped or blocked. This allows bacteria, viruses, and fungi to grow, which leads to infection. Most infections of the sinuses are caused by a virus. Sinusitis can develop quickly. It can last for up to 4 weeks (acute) or for more than 12 weeks (chronic). Sinusitis often develops after a cold. What are the causes? This condition is caused by anything that creates swelling in the sinuses or stops mucus from draining. This includes:  Allergies.  Asthma.  Infection from bacteria or viruses.  Deformities or blockages in your nose or sinuses.  Abnormal growths in the nose (nasal polyps).  Pollutants, such as chemicals or irritants in the air.  Infection from fungi (rare). What increases the risk? You are more likely to develop this condition if you:  Have a weak body defense system (immune system).  Do a lot of swimming or diving.  Overuse nasal sprays.  Smoke. What are the signs or symptoms? The main symptoms of this condition are pain and a feeling of pressure around the affected sinuses. Other symptoms include:  Stuffy nose or congestion.  Thick drainage from your nose.  Swelling and warmth over the affected sinuses.  Headache.  Upper toothache.  A cough that may get worse at night.  Extra mucus that collects in the throat or the back of the nose (postnasal drip).  Decreased sense of smell and taste.  Fatigue.  A fever.  Sore throat.  Bad breath. How is this diagnosed? This condition is diagnosed based on:  Your symptoms.  Your medical history.  A physical exam.  Tests to find out if your condition is  acute or chronic. This may include: ? Checking your nose for nasal polyps. ? Viewing your sinuses using a device that has a light (endoscope). ? Testing for allergies or bacteria. ? Imaging tests, such as an MRI or CT scan. In rare cases, a bone biopsy may be done to rule out more serious types of fungal sinus disease. How is this treated? Treatment for sinusitis depends on the cause and whether your condition is chronic or acute.  If caused by a virus, your symptoms should go away on their own within 10 days. You may be given medicines to relieve symptoms. They include: ? Medicines that shrink swollen nasal passages (topical intranasal decongestants). ? Medicines that treat allergies (antihistamines). ? A spray that eases inflammation of the nostrils (topical intranasal corticosteroids). ? Rinses that help get rid of thick mucus in your nose (nasal saline washes).  If caused by bacteria, your health care provider may recommend waiting to see if your symptoms improve. Most bacterial infections will get better without antibiotic medicine. You may be given antibiotics if you have: ? A severe infection. ? A weak immune system.  If caused by narrow nasal passages or nasal polyps, you may need to have surgery. Follow these instructions at home: Medicines  Take, use, or apply over-the-counter and prescription medicines only as told by your health care provider. These may include nasal sprays.  If you were prescribed an antibiotic medicine, take it as told by your health care provider. Do not stop taking the antibiotic even if you start   to feel better. Hydrate and humidify  Drink enough fluid to keep your urine pale yellow. Staying hydrated will help to thin your mucus.  Use a cool mist humidifier to keep the humidity level in your home above 50%.  Inhale steam for 10-15 minutes, 3-4 times a day, or as told by your health care provider. You can do this in the bathroom while a hot shower is  running.  Limit your exposure to cool or dry air.   Rest  Rest as much as possible.  Sleep with your head raised (elevated).  Make sure you get enough sleep each night. General instructions  Apply a warm, moist washcloth to your face 3-4 times a day or as told by your health care provider. This will help with discomfort.  Wash your hands often with soap and water to reduce your exposure to germs. If soap and water are not available, use hand sanitizer.  Do not smoke. Avoid being around people who are smoking (secondhand smoke).  Keep all follow-up visits as told by your health care provider. This is important.   Contact a health care provider if:  You have a fever.  Your symptoms get worse.  Your symptoms do not improve within 10 days. Get help right away if:  You have a severe headache.  You have persistent vomiting.  You have severe pain or swelling around your face or eyes.  You have vision problems.  You develop confusion.  Your neck is stiff.  You have trouble breathing. Summary  Sinusitis is soreness and inflammation of your sinuses. Sinuses are hollow spaces in the bones around your face.  This condition is caused by nasal tissues that become inflamed or swollen. The swelling traps or blocks the flow of mucus. This allows bacteria, viruses, and fungi to grow, which leads to infection.  If you were prescribed an antibiotic medicine, take it as told by your health care provider. Do not stop taking the antibiotic even if you start to feel better.  Keep all follow-up visits as told by your health care provider. This is important. This information is not intended to replace advice given to you by your health care provider. Make sure you discuss any questions you have with your health care provider. Document Revised: 03/28/2018 Document Reviewed: 03/28/2018 Elsevier Patient Education  2021 [employer]  

## 2021-03-28 NOTE — Progress Notes (Signed)
Ms. Christine, Morgan are scheduled for a virtual visit with your provider today.    Just as we do with appointments in the office, we must obtain your consent to participate.  Your consent will be active for this visit and any virtual visit you may have with one of our providers in the next 365 days.    If you have a MyChart account, I can also send a copy of this consent to you electronically.  All virtual visits are billed to your insurance company just like a traditional visit in the office.  As this is a virtual visit, video technology does not allow for your provider to perform a traditional examination.  This may limit your provider's ability to fully assess your condition.  If your provider identifies any concerns that need to be evaluated in person or the need to arrange testing such as labs, EKG, etc, we will make arrangements to do so.    Although advances in technology are sophisticated, we cannot ensure that it will always work on either your end or our end.  If the connection with a video visit is poor, we may have to switch to a telephone visit.  With either a video or telephone visit, we are not always able to ensure that we have a secure connection.   I need to obtain your verbal consent now.   Are you willing to proceed with your visit today?   Christine Morgan has provided verbal consent on 03/28/2021 for a virtual visit (video or telephone).   Margaretann Loveless, PA-C 03/28/2021  2:29 PM    MyChart Video Visit    Virtual Visit via Video Note   This visit type was conducted due to national recommendations for restrictions regarding the COVID-19 Pandemic (e.g. social distancing) in an effort to limit this patient's exposure and mitigate transmission in our community. This patient is at least at moderate risk for complications without adequate follow up. This format is felt to be most appropriate for this patient at this time. Physical exam was limited by quality of the video and audio  technology used for the visit.   Patient location: Home Provider location: Home office in [place]  I discussed the limitations of evaluation and management by telemedicine and the availability of in person appointments. The patient expressed understanding and agreed to proceed.  Patient: Christine Morgan   DOB: [birth year]   40 y.o. Female  MRN: [id number] Visit Date: 03/28/2021  Today's healthcare provider: Margaretann Loveless, PA-C   No chief complaint on file.  Subjective    URI  This is a new problem. The current episode started in the past 7 days. The problem has been gradually worsening. Maximum temperature: felt feverish last night but no temp taken. Associated symptoms include congestion, coughing, headaches, nausea (with coughing), rhinorrhea, sinus pain and a sore throat. Pertinent negatives include no ear pain, plugged ear sensation, swollen glands, vomiting or wheezing. She has tried acetaminophen, antihistamine, decongestant, sleep and increased fluids for the symptoms. The treatment provided no relief.    Patient reports taking 2 at home covid test that are negative.  Patient Active Problem List   Diagnosis Date Noted  . Pain in right toe(s) 10/30/2020  . Vitamin D deficiency 12/22/2018  . Leg numbness 08/12/2017  . Hypermobility syndrome 08/12/2017  . Flu-like symptoms 12/23/2016  . Sinusitis, acute 10/11/2014  . Routine general medical examination at a health care facility 08/28/2014  . Knee pain, bilateral 08/28/2014  . Bug  bite 08/28/2014  . Eustachian tube dysfunction 09/06/2013  . Migraine headache 11/24/2010  . ALLERGIC RHINITIS 11/24/2010  . Asthma 11/24/2010  . Irritable bowel syndrome 11/24/2010  . ALLERGY, INGESTED FOOD, DERMATITIS 11/24/2010   Past Medical History:  Diagnosis Date  . ALLERGIC RHINITIS   . ALLERGY, INGESTED FOOD, DERMATITIS   . ASTHMA   . Asthma   . Irritable bowel syndrome   . MIGRAINE HEADACHE   . PCOS (polycystic ovarian  syndrome)       Medications: Outpatient Medications Prior to Visit  Medication Sig  . albuterol (VENTOLIN HFA) 108 (90 Base) MCG/ACT inhaler Inhale 2 puffs into the lungs every 6 (six) hours as needed for wheezing or shortness of breath.  . Ascorbic Acid (VITAMIN C) 100 MG tablet Take 100 mg by mouth daily.  . Biotin 1 MG CAPS Take by mouth.  [name] [name] 0.35 MG tablet TK 1 T PO D  . cyanocobalamin 100 MCG tablet Take 100 mcg by mouth daily.  [name] ibuprofen (ADVIL,MOTRIN) 200 MG tablet Take 200 mg by mouth every 6 (six) hours as needed.  . Prenatal Vit-Fe Fumarate-FA (PRENATAL MULTIVITAMIN) TABS tablet Take 1 tablet by mouth daily at 12 noon.  . SUMAtriptan (IMITREX) 100 MG tablet TAKE 1 TABLET ONCE AS      NEEDED   No facility-administered medications prior to visit.    Review of Systems  Constitutional: Positive for chills and fatigue.  HENT: Positive for congestion, postnasal drip, rhinorrhea, sinus pressure, sinus pain and sore throat. Negative for ear pain.   Respiratory: Positive for cough. Negative for chest tightness, shortness of breath and wheezing.   Cardiovascular: Negative.   Gastrointestinal: Positive for nausea (with coughing). Negative for vomiting.  Neurological: Positive for headaches.    Last CBC Lab Results  Component Value Date   WBC 6.8 09/27/2020   HGB 13.4 09/27/2020   HCT 39.3 09/27/2020   MCV 92.5 09/27/2020   MCH 31.1 09/23/2018   RDW 13.3 09/27/2020   PLT 265.0 09/27/2020   Last metabolic panel Lab Results  Component Value Date   GLUCOSE 78 09/27/2020   NA 139 09/27/2020   K 4.2 09/27/2020   CL 105 09/27/2020   CO2 28 09/27/2020   BUN 10 09/27/2020   CREATININE 0.91 09/27/2020   CALCIUM 8.9 09/27/2020   PROT 7.0 09/27/2020   ALBUMIN 4.0 09/27/2020   BILITOT 0.3 09/27/2020   ALKPHOS 77 09/27/2020   AST 15 09/27/2020   ALT 12 09/27/2020      Objective    There were no vitals taken for this visit. BP Readings from Last 3 Encounters:   10/30/20 118/72  09/27/20 108/62  10/20/19 115/78   Wt Readings from Last 3 Encounters:  10/30/20 166 lb 6.4 oz (75.5 kg)  09/27/20 163 lb 8 oz (74.2 kg)  10/20/19 151 lb (68.5 kg)      Physical Exam Vitals reviewed.  Constitutional:      General: She is not in acute distress.    Appearance: Normal appearance. She is well-developed. She is ill-appearing.  HENT:     Head: Normocephalic and atraumatic.     Nose:     Comments: Patient reports significant tenderness through maxillary sinus and frontal sinus areas as well as sensitivity in her upper teeth and jaws from the pressure Pulmonary:     Effort: Pulmonary effort is normal. No respiratory distress.  Musculoskeletal:     Cervical back: Normal range of motion and neck supple.  Neurological:  Mental Status: She is alert.  Psychiatric:        Mood and Affect: Mood normal.        Behavior: Behavior normal.        Thought Content: Thought content normal.        Judgment: Judgment normal.        Assessment & Plan     1. Acute bacterial sinusitis - Worsening symptoms that have not responded to OTC medications.  - Will give Doxycycline as below.  - Continue allergy medications.  - Stay well hydrated and get plenty of rest.  - Seek in-person evaluation if no symptom improvement or if symptoms worsen. - doxycycline (VIBRA-TABS) 100 MG tablet; Take 1 tablet (100 mg total) by mouth 2 (two) times daily.  Dispense: 14 tablet; Refill: 0   No follow-ups on file.     I discussed the assessment and treatment plan with the patient. The patient was provided an opportunity to ask questions and all were answered. The patient agreed with the plan and demonstrated an understanding of the instructions.   The patient was advised to call back or seek an in-person evaluation if the symptoms worsen or if the condition fails to improve as anticipated.  I provided 16 minutes of face-to-face time during this encounter via MyChart Video  enabled encounter.   Reine Just [facility] [phone] (phone) [phone] (fax)  [facility]

## 2021-04-02 DIAGNOSIS — F419 Anxiety disorder, unspecified: Secondary | ICD-10-CM | POA: Diagnosis not present

## 2021-04-09 DIAGNOSIS — F419 Anxiety disorder, unspecified: Secondary | ICD-10-CM | POA: Diagnosis not present

## 2021-04-15 DIAGNOSIS — F419 Anxiety disorder, unspecified: Secondary | ICD-10-CM | POA: Diagnosis not present

## 2021-04-23 DIAGNOSIS — F419 Anxiety disorder, unspecified: Secondary | ICD-10-CM | POA: Diagnosis not present

## 2021-04-30 DIAGNOSIS — F419 Anxiety disorder, unspecified: Secondary | ICD-10-CM | POA: Diagnosis not present

## 2021-05-07 DIAGNOSIS — F419 Anxiety disorder, unspecified: Secondary | ICD-10-CM | POA: Diagnosis not present

## 2021-05-07 DIAGNOSIS — F99 Mental disorder, not otherwise specified: Secondary | ICD-10-CM | POA: Diagnosis not present

## 2021-05-14 DIAGNOSIS — F419 Anxiety disorder, unspecified: Secondary | ICD-10-CM | POA: Diagnosis not present

## 2021-05-28 DIAGNOSIS — F419 Anxiety disorder, unspecified: Secondary | ICD-10-CM | POA: Diagnosis not present

## 2021-06-04 DIAGNOSIS — F419 Anxiety disorder, unspecified: Secondary | ICD-10-CM | POA: Diagnosis not present

## 2021-06-11 DIAGNOSIS — F419 Anxiety disorder, unspecified: Secondary | ICD-10-CM | POA: Diagnosis not present

## 2021-06-18 DIAGNOSIS — F419 Anxiety disorder, unspecified: Secondary | ICD-10-CM | POA: Diagnosis not present

## 2021-06-25 DIAGNOSIS — F419 Anxiety disorder, unspecified: Secondary | ICD-10-CM | POA: Diagnosis not present

## 2021-07-02 DIAGNOSIS — F419 Anxiety disorder, unspecified: Secondary | ICD-10-CM | POA: Diagnosis not present

## 2021-07-16 DIAGNOSIS — F419 Anxiety disorder, unspecified: Secondary | ICD-10-CM | POA: Diagnosis not present

## 2021-07-23 DIAGNOSIS — F419 Anxiety disorder, unspecified: Secondary | ICD-10-CM | POA: Diagnosis not present

## 2021-08-06 DIAGNOSIS — F419 Anxiety disorder, unspecified: Secondary | ICD-10-CM | POA: Diagnosis not present

## 2021-08-20 DIAGNOSIS — F419 Anxiety disorder, unspecified: Secondary | ICD-10-CM | POA: Diagnosis not present

## 2021-08-27 DIAGNOSIS — F419 Anxiety disorder, unspecified: Secondary | ICD-10-CM | POA: Diagnosis not present

## 2021-09-03 DIAGNOSIS — F419 Anxiety disorder, unspecified: Secondary | ICD-10-CM | POA: Diagnosis not present

## 2021-09-10 DIAGNOSIS — F419 Anxiety disorder, unspecified: Secondary | ICD-10-CM | POA: Diagnosis not present

## 2021-09-17 DIAGNOSIS — F419 Anxiety disorder, unspecified: Secondary | ICD-10-CM | POA: Diagnosis not present

## 2021-09-24 DIAGNOSIS — F419 Anxiety disorder, unspecified: Secondary | ICD-10-CM | POA: Diagnosis not present

## 2021-09-29 ENCOUNTER — Other Ambulatory Visit: Payer: Self-pay

## 2021-09-30 ENCOUNTER — Ambulatory Visit (INDEPENDENT_AMBULATORY_CARE_PROVIDER_SITE_OTHER): Payer: BC Managed Care – PPO | Admitting: Family Medicine

## 2021-09-30 ENCOUNTER — Encounter: Payer: Self-pay | Admitting: Family Medicine

## 2021-09-30 VITALS — BP 102/60 | HR 74 | Temp 98.6°F | Ht 64.5 in | Wt 156.2 lb

## 2021-09-30 DIAGNOSIS — Z0000 Encounter for general adult medical examination without abnormal findings: Secondary | ICD-10-CM | POA: Diagnosis not present

## 2021-09-30 DIAGNOSIS — J4520 Mild intermittent asthma, uncomplicated: Secondary | ICD-10-CM

## 2021-09-30 DIAGNOSIS — Z1159 Encounter for screening for other viral diseases: Secondary | ICD-10-CM

## 2021-09-30 DIAGNOSIS — G43809 Other migraine, not intractable, without status migrainosus: Secondary | ICD-10-CM | POA: Diagnosis not present

## 2021-09-30 LAB — COMPREHENSIVE METABOLIC PANEL
ALT: 13 U/L (ref 0–35)
AST: 16 U/L (ref 0–37)
Albumin: 4.2 g/dL (ref 3.5–5.2)
Alkaline Phosphatase: 74 U/L (ref 39–117)
BUN: 8 mg/dL (ref 6–23)
CO2: 27 mEq/L (ref 19–32)
Calcium: 9.3 mg/dL (ref 8.4–10.5)
Chloride: 105 mEq/L (ref 96–112)
Creatinine, Ser: 0.96 mg/dL (ref 0.40–1.20)
GFR: 75.23 mL/min (ref >60.00)
Glucose, Bld: 78 mg/dL (ref 70–99)
Potassium: 4.7 mEq/L (ref 3.5–5.1)
Sodium: 137 mEq/L (ref 135–145)
Total Bilirubin: 1.2 mg/dL (ref 0.2–1.2)
Total Protein: 7.3 g/dL (ref 6.0–8.3)

## 2021-09-30 LAB — CBC
HCT: 40.9 % (ref 36.0–46.0)
Hemoglobin: 13.5 g/dL (ref 12.0–15.0)
MCHC: 33 g/dL (ref 30.0–36.0)
MCV: 94.3 fl (ref 78.0–100.0)
Platelets: 246 10*3/uL (ref 150.0–400.0)
RBC: 4.34 Mil/uL (ref 3.87–5.11)
RDW: 13.4 % (ref 11.5–15.5)
WBC: 4.7 10*3/uL (ref 4.0–10.5)

## 2021-09-30 LAB — LIPID PANEL
Cholesterol: 156 mg/dL (ref 0–200)
HDL: 59.3 mg/dL (ref >39.00)
LDL Cholesterol: 86 mg/dL (ref 0–99)
NonHDL: 96.21
Total CHOL/HDL Ratio: 3
Triglycerides: 51 mg/dL (ref 0.0–149.0)
VLDL: 10.2 mg/dL (ref 0.0–40.0)

## 2021-09-30 MED ORDER — ALBUTEROL SULFATE HFA 108 (90 BASE) MCG/ACT IN AERS
2.0000 | INHALATION_SPRAY | Freq: Four times a day (QID) | RESPIRATORY_TRACT | 2 refills | Status: DC | PRN
Start: 2021-09-30 — End: 2023-10-11

## 2021-09-30 MED ORDER — SUMATRIPTAN SUCCINATE 100 MG PO TABS: ORAL_TABLET | ORAL | 5 refills | Status: DC

## 2021-09-30 NOTE — Progress Notes (Signed)
Chief Complaint  Patient presents with   Annual Exam     [name] is here for a complete physical.   Her last physical was >1 year ago.  Current diet: in general, a "healthy" diet. Current exercise: HIIT, walking, wt lifting. Fatigue out of ordinary? No Seatbelt? Yes  Health Maintenance Pap/HPV- Yes Tetanus- Yes HIV screening- Yes Hep C screening- No  Past Medical History:  Diagnosis Date   ALLERGIC RHINITIS    ALLERGY, INGESTED FOOD, DERMATITIS    ASTHMA    Asthma    Irritable bowel syndrome    MIGRAINE HEADACHE    PCOS (polycystic ovarian syndrome)      Past Surgical History:  Procedure Laterality Date   GYNECOLOGIC CRYOSURGERY      Medications  Current Outpatient Medications on File Prior to Visit  Medication Sig Dispense Refill   albuterol (VENTOLIN HFA) 108 (90 Base) MCG/ACT inhaler Inhale 2 puffs into the lungs every 6 (six) hours as needed for wheezing or shortness of breath. 54 g 2   Ascorbic Acid (VITAMIN C) 100 MG tablet Take 100 mg by mouth daily.     Biotin 1 MG CAPS Take by mouth.     [name] 0.35 MG tablet TK 1 T PO D  6   cyanocobalamin 100 MCG tablet Take 100 mcg by mouth daily.     ibuprofen (ADVIL,MOTRIN) 200 MG tablet Take 200 mg by mouth every 6 (six) hours as needed.     Prenatal Vit-Fe Fumarate-FA (PRENATAL MULTIVITAMIN) TABS tablet Take 1 tablet by mouth daily at 12 noon.     SUMAtriptan (IMITREX) 100 MG tablet TAKE 1 TABLET ONCE AS      NEEDED 27 tablet 2   Allergies Allergies  Allergen Reactions   Penicillins Rash    Review of Systems: Constitutional:  no unexpected weight changes Eye:  no recent significant change in vision Ear/Nose/Mouth/Throat:  Ears:  no tinnitus or vertigo and no recent change in hearing Nose/Mouth/Throat:  no complaints of nasal congestion, no sore throat Cardiovascular: no chest pain Respiratory:  no cough and no shortness of breath Gastrointestinal:  no abdominal pain, no change in bowel  habits GU:  Female: negative for dysuria or pelvic pain Musculoskeletal/Extremities:  no pain of the joints Integumentary (Skin/Breast):  no abnormal skin lesions reported Neurologic:  +numbness over L thumb Endocrine:  denies fatigue Hematologic/Lymphatic:  No areas of easy bleeding  Exam BP 102/60   Pulse 74   Temp 98.6 F (37 C) (Oral)   Ht 5' 4.5" (1.638 m)   Wt 156 lb 4 oz (70.9 kg)   SpO2 96%   BMI 26.41 kg/m  General:  well developed, well nourished, in no apparent distress Skin:  no significant moles, warts, or growths Head:  no masses, lesions, or tenderness Eyes:  pupils equal and round, sclera anicteric without injection Ears:  canals without lesions, TMs shiny without retraction, no obvious effusion, no erythema Nose:  nares patent, septum midline, mucosa normal, and no drainage or sinus tenderness Throat/Pharynx:  lips and gingiva without lesion; tongue and uvula midline; non-inflamed pharynx; no exudates or postnasal drainage Neck: neck supple without adenopathy, thyromegaly, or masses Lungs:  clear to auscultation, breath sounds equal bilaterally, no respiratory distress Cardio:  regular rate and rhythm, no bruits, no LE edema Abdomen:  abdomen soft, nontender; bowel sounds normal; no masses or organomegaly Genital: Defer to GYN Musculoskeletal:  symmetrical muscle groups noted without atrophy or deformity Extremities:  no clubbing, cyanosis,  or edema, no deformities, no skin discoloration Neuro: decreased sensation over the palmar distal phalanx on L hand, 1st digit but intact to pinprick. No atrophy, +Phalen's on L, neg Tinel's, Spurling's.;gait normal; deep tendon reflexes normal and symmetric Psych: well oriented with normal range of affect and appropriate judgment/insight  Assessment and Plan  Well adult exam - Plan: CBC, Comprehensive metabolic panel, Lipid panel  Encounter for hepatitis C screening test for low risk patient - Plan: Hepatitis C  antibody  Mild intermittent asthma without complication - Plan: albuterol (VENTOLIN HFA) 108 (90 Base) MCG/ACT inhaler  Other migraine without status migrainosus, not intractable - Plan: SUMAtriptan (IMITREX) 100 MG tablet   Well 39 y.o. female. Counseled on diet and exercise. Other orders as above. Follow up in 1 yr or prn. The patient voiced understanding and agreement to the plan.  [name] [place], DO 09/30/21 11:15 AM

## 2021-09-30 NOTE — Patient Instructions (Signed)
Give [place] 2-3 business days to get the results of your labs back.   Keep the diet clean and stay active.  Don't worry about the skin unless there is a change.   Wear the brace at night and during aggravating activities.   Let us know if you need anything.

## 2021-10-01 LAB — HEPATITIS C ANTIBODY
Hepatitis C Ab: NONREACTIVE
SIGNAL TO CUT-OFF: 0.03 (ref <1.00)

## 2021-10-08 DIAGNOSIS — F419 Anxiety disorder, unspecified: Secondary | ICD-10-CM | POA: Diagnosis not present

## 2021-10-15 DIAGNOSIS — F419 Anxiety disorder, unspecified: Secondary | ICD-10-CM | POA: Diagnosis not present

## 2021-10-22 DIAGNOSIS — F419 Anxiety disorder, unspecified: Secondary | ICD-10-CM | POA: Diagnosis not present

## 2021-11-05 DIAGNOSIS — Z309 Encounter for contraceptive management, unspecified: Secondary | ICD-10-CM | POA: Diagnosis not present

## 2021-11-05 DIAGNOSIS — Z01419 Encounter for gynecological examination (general) (routine) without abnormal findings: Secondary | ICD-10-CM | POA: Diagnosis not present

## 2021-11-05 DIAGNOSIS — R87810 Cervical high risk human papillomavirus (HPV) DNA test positive: Secondary | ICD-10-CM | POA: Diagnosis not present

## 2021-11-12 DIAGNOSIS — F419 Anxiety disorder, unspecified: Secondary | ICD-10-CM | POA: Diagnosis not present

## 2021-11-19 DIAGNOSIS — F419 Anxiety disorder, unspecified: Secondary | ICD-10-CM | POA: Diagnosis not present

## 2021-11-26 DIAGNOSIS — F419 Anxiety disorder, unspecified: Secondary | ICD-10-CM | POA: Diagnosis not present

## 2021-12-03 DIAGNOSIS — F419 Anxiety disorder, unspecified: Secondary | ICD-10-CM | POA: Diagnosis not present

## 2021-12-22 DIAGNOSIS — F419 Anxiety disorder, unspecified: Secondary | ICD-10-CM | POA: Diagnosis not present

## 2021-12-31 DIAGNOSIS — F419 Anxiety disorder, unspecified: Secondary | ICD-10-CM | POA: Diagnosis not present

## 2022-01-14 DIAGNOSIS — F419 Anxiety disorder, unspecified: Secondary | ICD-10-CM | POA: Diagnosis not present

## 2022-01-22 DIAGNOSIS — N979 Female infertility, unspecified: Secondary | ICD-10-CM | POA: Diagnosis not present

## 2022-01-22 DIAGNOSIS — Z319 Encounter for procreative management, unspecified: Secondary | ICD-10-CM | POA: Diagnosis not present

## 2022-01-22 DIAGNOSIS — E282 Polycystic ovarian syndrome: Secondary | ICD-10-CM | POA: Diagnosis not present

## 2022-01-28 DIAGNOSIS — F419 Anxiety disorder, unspecified: Secondary | ICD-10-CM | POA: Diagnosis not present

## 2022-02-03 IMAGING — CR DG TOE GREAT 2+V*R*
3 series · 3 of 3 positions shown · non-contrast
Comparison: None.

CLINICAL DATA: Right toe joint pain and numbness

EXAM:
RIGHT GREAT TOE

[x toes ap right]
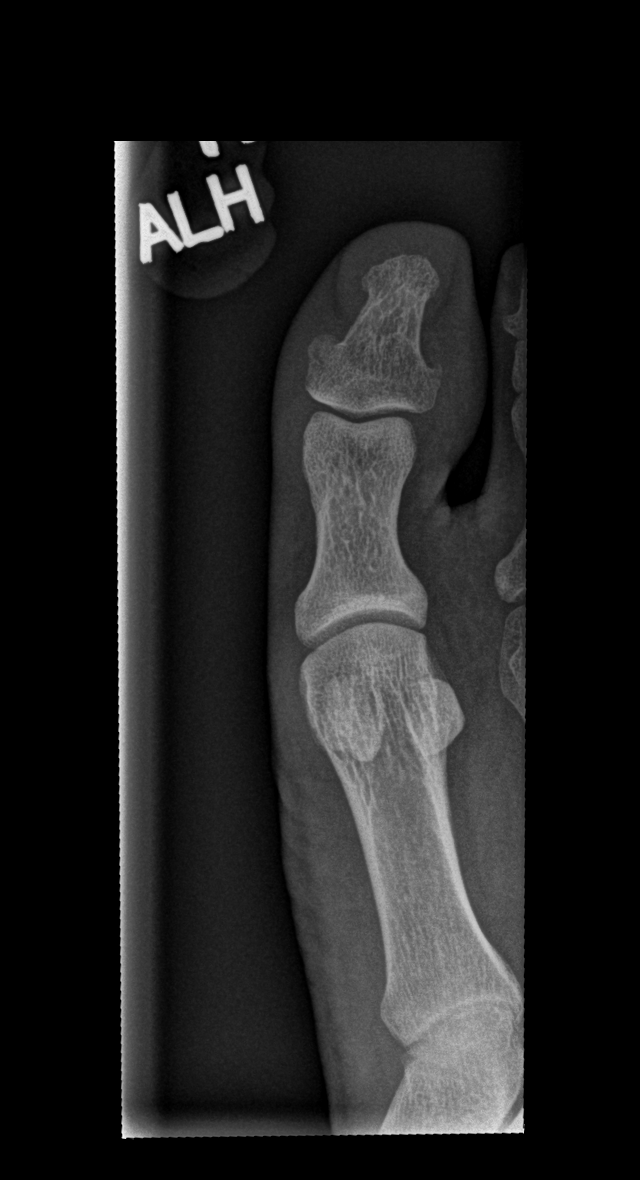

[x toes obl right]
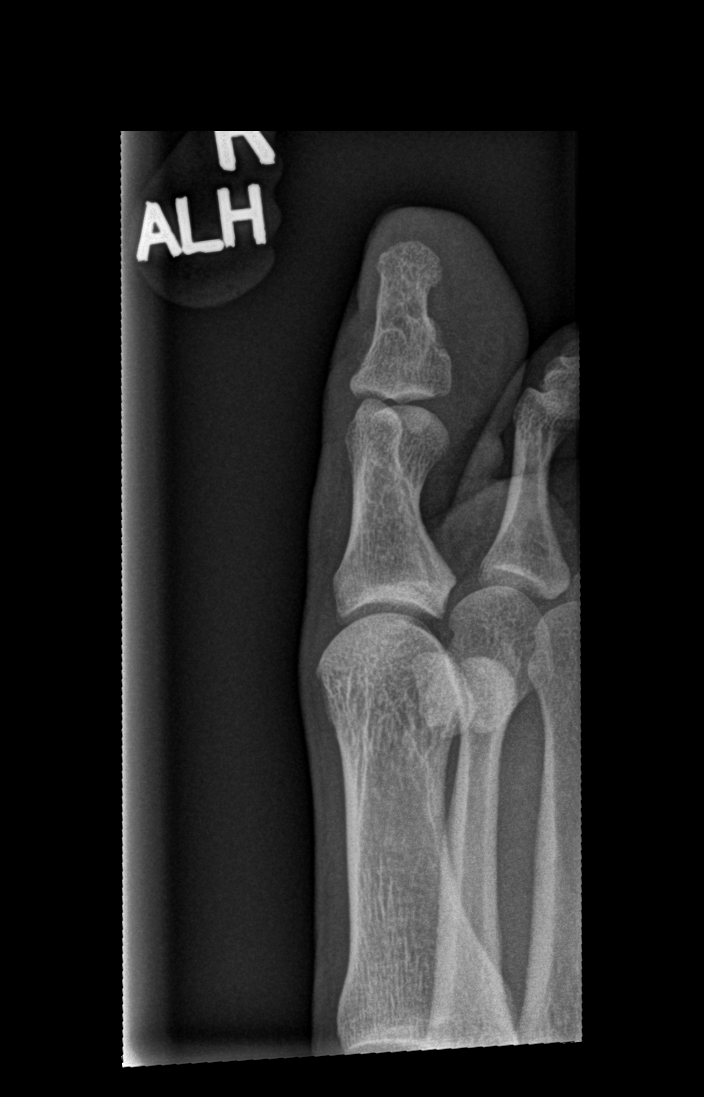

[x toes lat right]
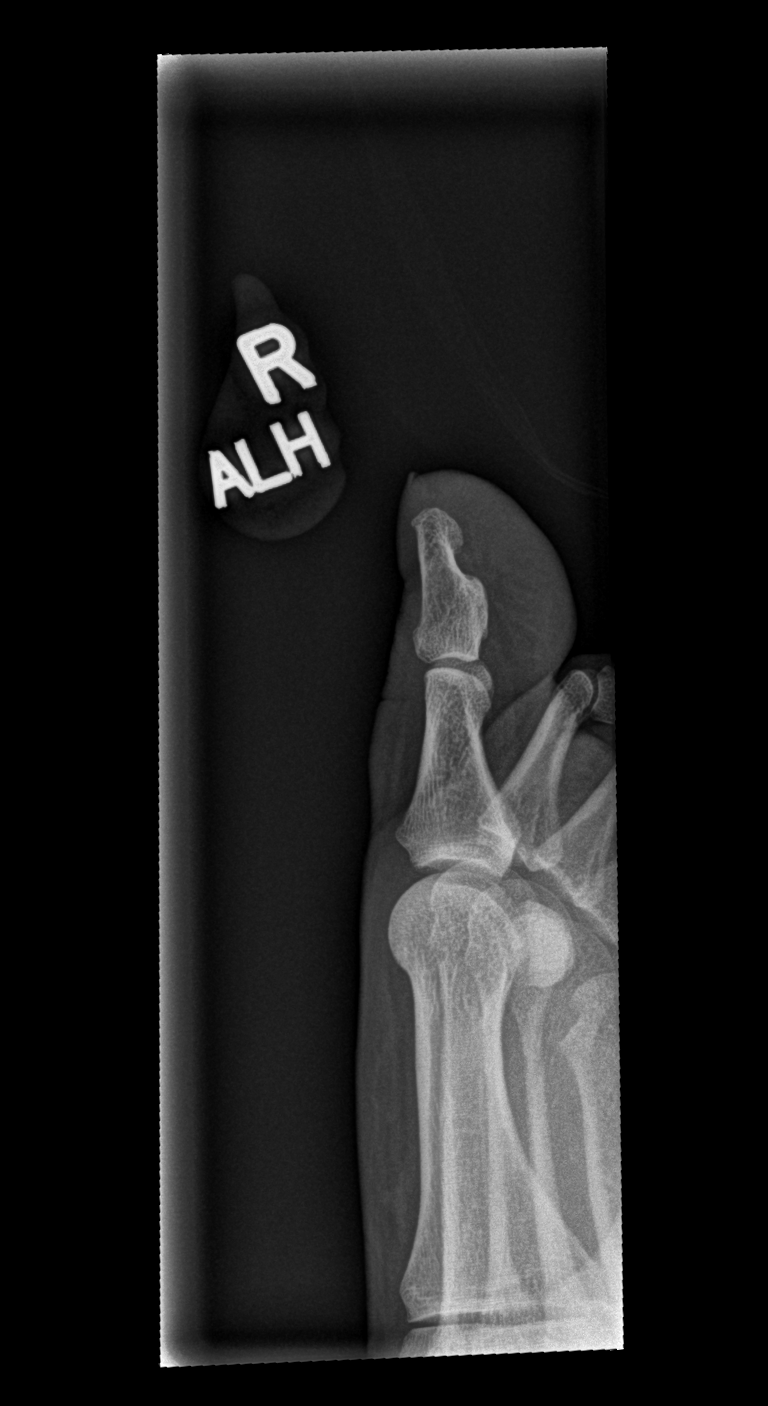

[3 of 3 positions shown; findings below may reference images not displayed]

FINDINGS: No fracture or dislocation is seen.

The joint spaces are preserved.

Visualized soft tissues are within normal limits.
IMPRESSION: Negative.

## 2022-02-04 DIAGNOSIS — F419 Anxiety disorder, unspecified: Secondary | ICD-10-CM | POA: Diagnosis not present

## 2022-02-10 DIAGNOSIS — E282 Polycystic ovarian syndrome: Secondary | ICD-10-CM | POA: Diagnosis not present

## 2022-02-10 DIAGNOSIS — N979 Female infertility, unspecified: Secondary | ICD-10-CM | POA: Diagnosis not present

## 2022-02-11 DIAGNOSIS — F419 Anxiety disorder, unspecified: Secondary | ICD-10-CM | POA: Diagnosis not present

## 2022-02-25 DIAGNOSIS — F419 Anxiety disorder, unspecified: Secondary | ICD-10-CM | POA: Diagnosis not present

## 2022-03-04 DIAGNOSIS — F419 Anxiety disorder, unspecified: Secondary | ICD-10-CM | POA: Diagnosis not present

## 2022-03-11 DIAGNOSIS — F419 Anxiety disorder, unspecified: Secondary | ICD-10-CM | POA: Diagnosis not present

## 2022-03-18 DIAGNOSIS — F419 Anxiety disorder, unspecified: Secondary | ICD-10-CM | POA: Diagnosis not present

## 2022-03-25 DIAGNOSIS — F419 Anxiety disorder, unspecified: Secondary | ICD-10-CM | POA: Diagnosis not present

## 2022-04-01 DIAGNOSIS — F419 Anxiety disorder, unspecified: Secondary | ICD-10-CM | POA: Diagnosis not present

## 2022-04-22 DIAGNOSIS — F419 Anxiety disorder, unspecified: Secondary | ICD-10-CM | POA: Diagnosis not present

## 2022-05-20 DIAGNOSIS — N912 Amenorrhea, unspecified: Secondary | ICD-10-CM | POA: Diagnosis not present

## 2022-05-22 DIAGNOSIS — F419 Anxiety disorder, unspecified: Secondary | ICD-10-CM | POA: Diagnosis not present

## 2022-05-25 DIAGNOSIS — N912 Amenorrhea, unspecified: Secondary | ICD-10-CM | POA: Diagnosis not present

## 2022-05-27 DIAGNOSIS — F419 Anxiety disorder, unspecified: Secondary | ICD-10-CM | POA: Diagnosis not present

## 2022-06-01 DIAGNOSIS — O039 Complete or unspecified spontaneous abortion without complication: Secondary | ICD-10-CM | POA: Diagnosis not present

## 2022-06-01 DIAGNOSIS — O200 Threatened abortion: Secondary | ICD-10-CM | POA: Diagnosis not present

## 2022-06-01 DIAGNOSIS — O4690 Antepartum hemorrhage, unspecified, unspecified trimester: Secondary | ICD-10-CM | POA: Diagnosis not present

## 2022-06-02 DIAGNOSIS — O039 Complete or unspecified spontaneous abortion without complication: Secondary | ICD-10-CM | POA: Diagnosis not present

## 2022-06-03 DIAGNOSIS — F419 Anxiety disorder, unspecified: Secondary | ICD-10-CM | POA: Diagnosis not present

## 2022-06-09 DIAGNOSIS — O021 Missed abortion: Secondary | ICD-10-CM | POA: Diagnosis not present

## 2022-06-27 DIAGNOSIS — F419 Anxiety disorder, unspecified: Secondary | ICD-10-CM | POA: Diagnosis not present

## 2022-07-01 DIAGNOSIS — F419 Anxiety disorder, unspecified: Secondary | ICD-10-CM | POA: Diagnosis not present

## 2022-07-08 DIAGNOSIS — F419 Anxiety disorder, unspecified: Secondary | ICD-10-CM | POA: Diagnosis not present

## 2022-07-09 ENCOUNTER — Other Ambulatory Visit: Payer: Self-pay | Admitting: Family Medicine

## 2022-07-09 DIAGNOSIS — G43809 Other migraine, not intractable, without status migrainosus: Secondary | ICD-10-CM

## 2022-07-15 DIAGNOSIS — F419 Anxiety disorder, unspecified: Secondary | ICD-10-CM | POA: Diagnosis not present

## 2022-07-22 DIAGNOSIS — F419 Anxiety disorder, unspecified: Secondary | ICD-10-CM | POA: Diagnosis not present

## 2022-07-29 DIAGNOSIS — F419 Anxiety disorder, unspecified: Secondary | ICD-10-CM | POA: Diagnosis not present

## 2022-08-12 DIAGNOSIS — F419 Anxiety disorder, unspecified: Secondary | ICD-10-CM | POA: Diagnosis not present

## 2022-08-19 DIAGNOSIS — F419 Anxiety disorder, unspecified: Secondary | ICD-10-CM | POA: Diagnosis not present

## 2022-08-26 DIAGNOSIS — F419 Anxiety disorder, unspecified: Secondary | ICD-10-CM | POA: Diagnosis not present

## 2022-09-02 DIAGNOSIS — F419 Anxiety disorder, unspecified: Secondary | ICD-10-CM | POA: Diagnosis not present

## 2022-09-16 DIAGNOSIS — F419 Anxiety disorder, unspecified: Secondary | ICD-10-CM | POA: Diagnosis not present

## 2022-09-23 DIAGNOSIS — F419 Anxiety disorder, unspecified: Secondary | ICD-10-CM | POA: Diagnosis not present

## 2022-10-07 ENCOUNTER — Encounter: Payer: Self-pay | Admitting: Family Medicine

## 2022-10-07 ENCOUNTER — Ambulatory Visit (INDEPENDENT_AMBULATORY_CARE_PROVIDER_SITE_OTHER): Payer: BC Managed Care – PPO | Admitting: Family Medicine

## 2022-10-07 VITALS — BP 118/62 | HR 67 | Temp 98.1°F | Ht 64.0 in | Wt 160.4 lb

## 2022-10-07 DIAGNOSIS — Z0000 Encounter for general adult medical examination without abnormal findings: Secondary | ICD-10-CM | POA: Diagnosis not present

## 2022-10-07 DIAGNOSIS — F419 Anxiety disorder, unspecified: Secondary | ICD-10-CM | POA: Diagnosis not present

## 2022-10-07 LAB — COMPREHENSIVE METABOLIC PANEL
ALT: 16 U/L (ref 0–35)
AST: 18 U/L (ref 0–37)
Albumin: 4 g/dL (ref 3.5–5.2)
Alkaline Phosphatase: 70 U/L (ref 39–117)
BUN: 9 mg/dL (ref 6–23)
CO2: 31 mEq/L (ref 19–32)
Calcium: 9.1 mg/dL (ref 8.4–10.5)
Chloride: 105 mEq/L (ref 96–112)
Creatinine, Ser: 0.92 mg/dL (ref 0.40–1.20)
GFR: 78.6 mL/min (ref >60.00)
Glucose, Bld: 82 mg/dL (ref 70–99)
Potassium: 4.2 mEq/L (ref 3.5–5.1)
Sodium: 140 mEq/L (ref 135–145)
Total Bilirubin: 0.5 mg/dL (ref 0.2–1.2)
Total Protein: 7 g/dL (ref 6.0–8.3)

## 2022-10-07 LAB — CBC
HCT: 40.1 % (ref 36.0–46.0)
Hemoglobin: 13.6 g/dL (ref 12.0–15.0)
MCHC: 33.9 g/dL (ref 30.0–36.0)
MCV: 93.1 fl (ref 78.0–100.0)
Platelets: 262 10*3/uL (ref 150.0–400.0)
RBC: 4.31 Mil/uL (ref 3.87–5.11)
RDW: 13.1 % (ref 11.5–15.5)
WBC: 5.2 10*3/uL (ref 4.0–10.5)

## 2022-10-07 LAB — LIPID PANEL
Cholesterol: 168 mg/dL (ref 0–200)
HDL: 59.1 mg/dL (ref >39.00)
LDL Cholesterol: 93 mg/dL (ref 0–99)
NonHDL: 108.66
Total CHOL/HDL Ratio: 3
Triglycerides: 76 mg/dL (ref 0.0–149.0)
VLDL: 15.2 mg/dL (ref 0.0–40.0)

## 2022-10-07 NOTE — Progress Notes (Signed)
Chief Complaint  Patient presents with   Annual Exam    Toe pain/tenderness     [name] is here for a complete physical.   Her last physical was >1 year ago.  Current diet: in general, a "healthy" diet. Current exercise: HIIT, wt resistance, cardio. Fatigue out of ordinary? No Seatbelt? Yes Advanced directive? No  Health Maintenance Pap/HPV- Yes Tetanus- Yes HIV screening- Yes Hep C screening- Yes  Past Medical History:  Diagnosis Date   ALLERGIC RHINITIS    ALLERGY, INGESTED FOOD, DERMATITIS    ASTHMA    Asthma    Irritable bowel syndrome    MIGRAINE HEADACHE    PCOS (polycystic ovarian syndrome)      Past Surgical History:  Procedure Laterality Date   GYNECOLOGIC CRYOSURGERY      Medications  Current Outpatient Medications on File Prior to Visit  Medication Sig Dispense Refill   albuterol (VENTOLIN HFA) 108 (90 Base) MCG/ACT inhaler Inhale 2 puffs into the lungs every 6 (six) hours as needed for wheezing or shortness of breath. 18 g 2   Prenatal Vit-Fe Fumarate-FA (PRENATAL MULTIVITAMIN) TABS tablet Take 1 tablet by mouth daily at 12 noon.     SUMAtriptan (IMITREX) 100 MG tablet Take 1 tablet (100 mg total) by mouth once as needed for up to 1 dose for migraine. May repeat in 2 hours if needed. 10 tablet 3    Allergies Allergies  Allergen Reactions   Penicillins Rash    Review of Systems: Constitutional:  no unexpected weight changes Eye:  no recent significant change in vision Ear/Nose/Mouth/Throat:  Ears:  no tinnitus or vertigo and no recent change in hearing Nose/Mouth/Throat:  no complaints of nasal congestion, no sore throat Cardiovascular: no chest pain Respiratory:  no cough and no shortness of breath Gastrointestinal:  no abdominal pain, no change in bowel habits GU:  Female: negative for dysuria or pelvic pain Musculoskeletal/Extremities:  no pain of the joints Integumentary (Skin/Breast):  no abnormal skin lesions  reported Neurologic:  no headaches Endocrine:  denies fatigue Hematologic/Lymphatic:  No areas of easy bleeding  Exam BP 118/62 (BP Location: Left Arm, Patient Position: Sitting, Cuff Size: Normal)   Pulse 67   Temp 98.1 F (36.7 C) (Oral)   Ht 5\' 4"  (1.626 m)   Wt 160 lb 6 oz (72.7 kg)   SpO2 98%   BMI 27.53 kg/m  General:  well developed, well nourished, in no apparent distress Skin:  no significant moles, warts, or growths Head:  no masses, lesions, or tenderness Eyes:  pupils equal and round, sclera anicteric without injection Ears:  canals without lesions, TMs shiny without retraction, no obvious effusion, no erythema Nose:  nares patent, mucosa normal, and no drainage  Throat/Pharynx:  lips and gingiva without lesion; tongue and uvula midline; non-inflamed pharynx; no exudates or postnasal drainage Neck: neck supple without adenopathy, thyromegaly, or masses Lungs:  clear to auscultation, breath sounds equal bilaterally, no respiratory distress Cardio:  regular rate and rhythm, no bruits, no LE edema Abdomen:  abdomen soft, nontender; bowel sounds normal; no masses or organomegaly Genital: Defer to GYN Musculoskeletal:  symmetrical muscle groups noted without atrophy or deformity Extremities:  no clubbing, cyanosis, or edema, no deformities, no skin discoloration Neuro:  gait normal; deep tendon reflexes normal and symmetric Psych: well oriented with normal range of affect and appropriate judgment/insight  Assessment and Plan  Well adult exam - Plan: CBC, Comprehensive metabolic panel, Lipid panel  Well 41 y.o.  female. Counseled on diet and exercise. Advanced directive form provided today.  Other orders as above. Follow up in 1 yr or prn. The patient voiced understanding and agreement to the plan.  [name] [place], DO 10/07/22 8:21 AM

## 2022-10-07 NOTE — Patient Instructions (Signed)
Give us 2-3 business days to get the results of your labs back.   Keep the diet clean and stay active.  Please get me a copy of your advanced directive form at your convenience.   Let us know if you need anything.  

## 2022-10-14 DIAGNOSIS — F419 Anxiety disorder, unspecified: Secondary | ICD-10-CM | POA: Diagnosis not present

## 2022-10-21 DIAGNOSIS — F419 Anxiety disorder, unspecified: Secondary | ICD-10-CM | POA: Diagnosis not present

## 2022-10-28 DIAGNOSIS — F419 Anxiety disorder, unspecified: Secondary | ICD-10-CM | POA: Diagnosis not present

## 2022-10-29 DIAGNOSIS — N979 Female infertility, unspecified: Secondary | ICD-10-CM | POA: Diagnosis not present

## 2022-10-29 DIAGNOSIS — E282 Polycystic ovarian syndrome: Secondary | ICD-10-CM | POA: Diagnosis not present

## 2022-10-29 DIAGNOSIS — Z319 Encounter for procreative management, unspecified: Secondary | ICD-10-CM | POA: Diagnosis not present

## 2022-11-16 DIAGNOSIS — Z1159 Encounter for screening for other viral diseases: Secondary | ICD-10-CM | POA: Diagnosis not present

## 2022-11-16 DIAGNOSIS — Z113 Encounter for screening for infections with a predominantly sexual mode of transmission: Secondary | ICD-10-CM | POA: Diagnosis not present

## 2022-11-16 DIAGNOSIS — Z01419 Encounter for gynecological examination (general) (routine) without abnormal findings: Secondary | ICD-10-CM | POA: Diagnosis not present

## 2022-11-16 LAB — HM PAP SMEAR
Chlamydia, Swab/Urine, PCR: NEGATIVE
HM Pap smear: NEGATIVE

## 2022-11-17 LAB — RESULTS CONSOLE HPV: CHL HPV: NEGATIVE

## 2022-11-18 DIAGNOSIS — F419 Anxiety disorder, unspecified: Secondary | ICD-10-CM | POA: Diagnosis not present

## 2022-11-25 DIAGNOSIS — F419 Anxiety disorder, unspecified: Secondary | ICD-10-CM | POA: Diagnosis not present

## 2022-12-02 DIAGNOSIS — F419 Anxiety disorder, unspecified: Secondary | ICD-10-CM | POA: Diagnosis not present

## 2022-12-09 DIAGNOSIS — F419 Anxiety disorder, unspecified: Secondary | ICD-10-CM | POA: Diagnosis not present

## 2022-12-09 DIAGNOSIS — N979 Female infertility, unspecified: Secondary | ICD-10-CM | POA: Diagnosis not present

## 2022-12-09 DIAGNOSIS — Z319 Encounter for procreative management, unspecified: Secondary | ICD-10-CM | POA: Diagnosis not present

## 2022-12-09 DIAGNOSIS — E282 Polycystic ovarian syndrome: Secondary | ICD-10-CM | POA: Diagnosis not present

## 2022-12-14 ENCOUNTER — Encounter: Payer: Self-pay | Admitting: Family Medicine

## 2022-12-14 DIAGNOSIS — N979 Female infertility, unspecified: Secondary | ICD-10-CM | POA: Diagnosis not present

## 2022-12-14 DIAGNOSIS — Z319 Encounter for procreative management, unspecified: Secondary | ICD-10-CM | POA: Diagnosis not present

## 2022-12-14 DIAGNOSIS — Z3162 Encounter for fertility preservation counseling: Secondary | ICD-10-CM | POA: Diagnosis not present

## 2022-12-15 ENCOUNTER — Encounter: Payer: Self-pay | Admitting: Family Medicine

## 2022-12-15 ENCOUNTER — Ambulatory Visit (INDEPENDENT_AMBULATORY_CARE_PROVIDER_SITE_OTHER): Payer: BC Managed Care – PPO | Admitting: Family Medicine

## 2022-12-15 VITALS — BP 108/68 | HR 97 | Temp 98.3°F | Ht 64.5 in | Wt 160.4 lb

## 2022-12-15 DIAGNOSIS — Z23 Encounter for immunization: Secondary | ICD-10-CM

## 2022-12-15 DIAGNOSIS — M795 Residual foreign body in soft tissue: Secondary | ICD-10-CM

## 2022-12-15 NOTE — Patient Instructions (Addendum)
OK to take Tylenol 1000 mg (2 extra strength tabs) or 975 mg (3 regular strength tabs) every 6 hours as needed.  Ibuprofen 400-600 mg (2-3 over the counter strength tabs) every 6 hours as needed for pain.  When you do wash it, use only soap and water. Do not vigorously scrub. Keep the area clean and dry.   Things to look out for: increasing pain not relieved by ibuprofen/acetaminophen, fevers, spreading redness, drainage of pus, or foul odor.  Let us know if you need anything.

## 2022-12-15 NOTE — Progress Notes (Signed)
Chief Complaint  Patient presents with   Foreign Body in Skin    Under right hand middle finger    Christine Morgan is a 42 y.o. female here for a skin complaint.  Duration: 2 days Location: R middle finger Pruritic? No Painful? Yes Drainage? No Splinter got under skin.  Other associated symptoms: no fevers  Past Medical History:  Diagnosis Date   ALLERGIC RHINITIS    ALLERGY, INGESTED FOOD, DERMATITIS    ASTHMA    Asthma    Irritable bowel syndrome    MIGRAINE HEADACHE    PCOS (polycystic ovarian syndrome)     BP 108/68 (BP Location: Right Arm, Patient Position: Sitting, Cuff Size: Normal)   Pulse 97   Temp 98.3 F (36.8 C) (Oral)   Ht 5' 4.5" (1.638 m)   Wt 160 lb 6 oz (72.7 kg)   SpO2 99%   BMI 27.10 kg/m  Gen: awake, alert, appearing stated age Lungs: No accessory muscle use Skin: 3rd R digit, 2 splinters under nail bed, one longitudinal, one at angle. Mild ttp over distal phalanx and soft tissue edema.  No drainage, erythema, fluctuance, excoriation Psych: Age appropriate judgment and insight  Procedure note: Foreign body removal Informed consent obtained The base of the right middle finger was cleaned with alcohol and a ring block was made with 2% lidocaine without epinephrine.  A total of 4 cc was used. Alcohol was used over the tip of the middle finger and tweezers were used to remove the first foreign body/splinter. As I began to use straight hemostats to elevate the nail plate, she did report some pain so 0.3 cc of 2% lidocaine was injected locally. Adequate anesthesia was then obtained.  The nail plate was lifted distally and a small wedge of the plate was cut out.  A small portion of the second foreign body was removed but the major portion remained. At this point, I elevated the nail further down and was able to remove the entire foreign body. The area was cleaned and a bandage was placed. Other than noted above, the patient tolerated the procedure well and  there were no immediate complications noted.  Foreign body (FB) in soft tissue - Plan: PR INCISION & REMOVAL FOREIGN BODY SUBQ TISS SIMPLE  Need for Tdap vaccination - Plan: Tdap vaccine greater than or equal to 7yo IM  Aftercare instructions verbalized and written down.  Tdap updated today.  Foreign body successfully removed today. F/u prn. The patient voiced understanding and agreement to the plan.  [place], DO 12/15/22 3:38 PM

## 2022-12-16 DIAGNOSIS — F419 Anxiety disorder, unspecified: Secondary | ICD-10-CM | POA: Diagnosis not present

## 2022-12-23 DIAGNOSIS — N979 Female infertility, unspecified: Secondary | ICD-10-CM | POA: Diagnosis not present

## 2022-12-23 DIAGNOSIS — Z3162 Encounter for fertility preservation counseling: Secondary | ICD-10-CM | POA: Diagnosis not present

## 2022-12-23 DIAGNOSIS — Z319 Encounter for procreative management, unspecified: Secondary | ICD-10-CM | POA: Diagnosis not present

## 2022-12-23 DIAGNOSIS — F419 Anxiety disorder, unspecified: Secondary | ICD-10-CM | POA: Diagnosis not present

## 2022-12-30 DIAGNOSIS — F419 Anxiety disorder, unspecified: Secondary | ICD-10-CM | POA: Diagnosis not present

## 2023-01-01 DIAGNOSIS — Z3162 Encounter for fertility preservation counseling: Secondary | ICD-10-CM | POA: Diagnosis not present

## 2023-01-01 DIAGNOSIS — N979 Female infertility, unspecified: Secondary | ICD-10-CM | POA: Diagnosis not present

## 2023-01-01 DIAGNOSIS — Z3183 Encounter for assisted reproductive fertility procedure cycle: Secondary | ICD-10-CM | POA: Diagnosis not present

## 2023-01-01 DIAGNOSIS — Z319 Encounter for procreative management, unspecified: Secondary | ICD-10-CM | POA: Diagnosis not present

## 2023-01-04 DIAGNOSIS — N979 Female infertility, unspecified: Secondary | ICD-10-CM | POA: Diagnosis not present

## 2023-01-04 DIAGNOSIS — Z3183 Encounter for assisted reproductive fertility procedure cycle: Secondary | ICD-10-CM | POA: Diagnosis not present

## 2023-01-05 DIAGNOSIS — N979 Female infertility, unspecified: Secondary | ICD-10-CM | POA: Diagnosis not present

## 2023-01-05 DIAGNOSIS — Z319 Encounter for procreative management, unspecified: Secondary | ICD-10-CM | POA: Diagnosis not present

## 2023-01-06 DIAGNOSIS — N979 Female infertility, unspecified: Secondary | ICD-10-CM | POA: Diagnosis not present

## 2023-01-06 DIAGNOSIS — Z3183 Encounter for assisted reproductive fertility procedure cycle: Secondary | ICD-10-CM | POA: Diagnosis not present

## 2023-01-08 DIAGNOSIS — Z319 Encounter for procreative management, unspecified: Secondary | ICD-10-CM | POA: Diagnosis not present

## 2023-01-08 DIAGNOSIS — N979 Female infertility, unspecified: Secondary | ICD-10-CM | POA: Diagnosis not present

## 2023-01-08 DIAGNOSIS — Z3183 Encounter for assisted reproductive fertility procedure cycle: Secondary | ICD-10-CM | POA: Diagnosis not present

## 2023-01-09 DIAGNOSIS — E282 Polycystic ovarian syndrome: Secondary | ICD-10-CM | POA: Diagnosis not present

## 2023-01-09 DIAGNOSIS — N979 Female infertility, unspecified: Secondary | ICD-10-CM | POA: Diagnosis not present

## 2023-01-11 DIAGNOSIS — N979 Female infertility, unspecified: Secondary | ICD-10-CM | POA: Diagnosis not present

## 2023-01-11 DIAGNOSIS — Z3183 Encounter for assisted reproductive fertility procedure cycle: Secondary | ICD-10-CM | POA: Diagnosis not present

## 2023-01-18 DIAGNOSIS — F419 Anxiety disorder, unspecified: Secondary | ICD-10-CM | POA: Diagnosis not present

## 2023-01-27 DIAGNOSIS — F419 Anxiety disorder, unspecified: Secondary | ICD-10-CM | POA: Diagnosis not present

## 2023-01-28 ENCOUNTER — Telehealth: Payer: BC Managed Care – PPO | Admitting: Physician Assistant

## 2023-01-28 DIAGNOSIS — J208 Acute bronchitis due to other specified organisms: Secondary | ICD-10-CM | POA: Diagnosis not present

## 2023-01-28 DIAGNOSIS — J4521 Mild intermittent asthma with (acute) exacerbation: Secondary | ICD-10-CM | POA: Diagnosis not present

## 2023-01-28 MED ORDER — BENZONATATE 100 MG PO CAPS: 100.0000 mg | ORAL_CAPSULE | Freq: Three times a day (TID) | ORAL | 0 refills | Status: DC | PRN

## 2023-01-28 MED ORDER — PREDNISONE 20 MG PO TABS: 40.0000 mg | ORAL_TABLET | Freq: Every day | ORAL | 0 refills | Status: DC

## 2023-01-28 NOTE — Progress Notes (Signed)
I have spent 5 minutes in review of e-visit questionnaire, review and updating patient chart, medical decision making and response to patient.   Trayton Szabo Cody Sephiroth [place], PA-C    

## 2023-01-28 NOTE — Progress Notes (Signed)
E-Visit for Cough  We are sorry that you are not feeling well.  Here is how we plan to help!  Based on your presentation I believe you most likely have A cough due to a virus.  This is called viral bronchitis and is best treated by rest, plenty of fluids and control of the cough.  You may use Ibuprofen or Tylenol as directed to help your symptoms.     In addition you may use A prescription cough medication called Tessalon Perles 100mg . You may take 1-2 capsules every 8 hours as needed for your cough.  Since this is causing an exacerbation of your asthma, I have also added on a 5-day burst of prednisone 40 mg daily.   From your responses in the eVisit questionnaire you describe inflammation in the upper respiratory tract which is causing a significant cough.  This is commonly called Bronchitis and has four common causes:   Allergies Viral Infections Acid Reflux Bacterial Infection Allergies, viruses and acid reflux are treated by controlling symptoms or eliminating the cause. An example might be a cough caused by taking certain blood pressure medications. You stop the cough by changing the medication. Another example might be a cough caused by acid reflux. Controlling the reflux helps control the cough.  USE OF BRONCHODILATOR ("RESCUE") INHALERS: There is a risk from using your bronchodilator too frequently.  The risk is that over-reliance on a medication which only relaxes the muscles surrounding the breathing tubes can reduce the effectiveness of medications prescribed to reduce swelling and congestion of the tubes themselves.  Although you feel brief relief from the bronchodilator inhaler, your asthma may actually be worsening with the tubes becoming more swollen and filled with mucus.  This can delay other crucial treatments, such as oral steroid medications. If you need to use a bronchodilator inhaler daily, several times per day, you should discuss this with your provider.  There are probably  better treatments that could be used to keep your asthma under control.     HOME CARE Only take medications as instructed by your medical team. Complete the entire course of an antibiotic. Drink plenty of fluids and get plenty of rest. Avoid close contacts especially the very young and the elderly Cover your mouth if you cough or cough into your sleeve. Always remember to wash your hands A steam or ultrasonic humidifier can help congestion.   GET HELP RIGHT AWAY IF: You develop worsening fever. You become short of breath You cough up blood. Your symptoms persist after you have completed your treatment plan MAKE SURE YOU  Understand these instructions. Will watch your condition. Will get help right away if you are not doing well or get worse.    Thank you for choosing an e-visit.  Your e-visit answers were reviewed by a board certified advanced clinical practitioner to complete your personal care plan. Depending upon the condition, your plan could have included both over the counter or prescription medications.  Please review your pharmacy choice. Make sure the pharmacy is open so you can pick up prescription now. If there is a problem, you may contact your provider through [employer] and have the prescription routed to another pharmacy.  Your safety is important to [place]. If you have drug allergies check your prescription carefully.   For the next 24 hours you can use MyChart to ask questions about today's visit, request a non-urgent call back, or ask for a work or school excuse. You will get an email  in the next two days asking about your experience. I hope that your e-visit has been valuable and will speed your recovery.

## 2023-02-10 DIAGNOSIS — F419 Anxiety disorder, unspecified: Secondary | ICD-10-CM | POA: Diagnosis not present

## 2023-02-17 DIAGNOSIS — F419 Anxiety disorder, unspecified: Secondary | ICD-10-CM | POA: Diagnosis not present

## 2023-02-19 ENCOUNTER — Ambulatory Visit: Payer: BC Managed Care – PPO | Admitting: Family Medicine

## 2023-02-19 VITALS — BP 104/62 | HR 93 | Temp 98.1°F | Ht 64.5 in | Wt 158.1 lb

## 2023-02-19 DIAGNOSIS — T7840XA Allergy, unspecified, initial encounter: Secondary | ICD-10-CM | POA: Diagnosis not present

## 2023-02-19 DIAGNOSIS — J4520 Mild intermittent asthma, uncomplicated: Secondary | ICD-10-CM | POA: Diagnosis not present

## 2023-02-19 MED ORDER — MONTELUKAST SODIUM 10 MG PO TABS: 10.0000 mg | ORAL_TABLET | Freq: Every day | ORAL | 3 refills | Status: DC

## 2023-02-19 NOTE — Progress Notes (Signed)
Chief Complaint  Patient presents with   Cough    [facility] here for URI complaints.  Duration:  3.5  weeks  Associated symptoms: itchy watery eyes and wheezing, coughing Denies: sinus congestion, sinus pain, rhinorrhea, ear pain, ear drainage, sore throat, wheezing, shortness of breath, myalgia, and fevers Treatment to date: Prednisone, Robitussin, Tessalon Perles Sick contacts: No  Past Medical History:  Diagnosis Date   ALLERGIC RHINITIS    ALLERGY, INGESTED FOOD, DERMATITIS    ASTHMA    Asthma    Irritable bowel syndrome    MIGRAINE HEADACHE    PCOS (polycystic ovarian syndrome)     Objective BP 104/62 (BP Location: Left Arm, Patient Position: Sitting, Cuff Size: Normal)   Pulse 93   Temp 98.1 F (36.7 C) (Oral)   Ht 5' 4.5" (1.638 m)   Wt 158 lb 2 oz (71.7 kg)   SpO2 99%   BMI 26.72 kg/m  General: Awake, alert, appears stated age HEENT: AT, [place], ears patent b/l and TM's neg, nares patent w/o discharge, turbinates are pale, pharynx pink and without exudates, MMM Neck: No masses or asymmetry Heart: RRR Lungs: CTAB, no accessory muscle use Psych: Age appropriate judgment and insight, normal mood and affect  Allergy, initial encounter - Plan: montelukast (SINGULAIR) 10 MG tablet  Mild intermittent asthma without complication - Plan: montelukast (SINGULAIR) 10 MG tablet  Exacerbation of above 2/2 pollen/virus. Add Singulair 10 mg/d. May need to use during certain seasons. She will send a message in 1-2 weeks if no better and will add an ICS. Continue to push fluids, practice good hand hygiene, cover mouth when coughing. F/u prn. If starting to experience fevers, shaking, or shortness of breath, seek immediate care. Pt voiced understanding and agreement to the plan.  [name] [place], DO 02/19/23 3:00 PM

## 2023-02-19 NOTE — Patient Instructions (Signed)
Continue the Claritin and Flonase.   We are starting a new oral medication. If no improvement in the next 1-2 weeks please let me know.  Let us know if you need anything.

## 2023-02-22 ENCOUNTER — Encounter: Payer: Self-pay | Admitting: *Deleted

## 2023-02-24 DIAGNOSIS — F419 Anxiety disorder, unspecified: Secondary | ICD-10-CM | POA: Diagnosis not present

## 2023-03-10 DIAGNOSIS — F419 Anxiety disorder, unspecified: Secondary | ICD-10-CM | POA: Diagnosis not present

## 2023-03-24 DIAGNOSIS — F419 Anxiety disorder, unspecified: Secondary | ICD-10-CM | POA: Diagnosis not present

## 2023-04-02 DIAGNOSIS — F419 Anxiety disorder, unspecified: Secondary | ICD-10-CM | POA: Diagnosis not present

## 2023-04-21 DIAGNOSIS — F419 Anxiety disorder, unspecified: Secondary | ICD-10-CM | POA: Diagnosis not present

## 2023-04-28 DIAGNOSIS — F419 Anxiety disorder, unspecified: Secondary | ICD-10-CM | POA: Diagnosis not present

## 2023-05-05 DIAGNOSIS — F419 Anxiety disorder, unspecified: Secondary | ICD-10-CM | POA: Diagnosis not present

## 2023-05-11 DIAGNOSIS — F419 Anxiety disorder, unspecified: Secondary | ICD-10-CM | POA: Diagnosis not present

## 2023-06-02 DIAGNOSIS — F419 Anxiety disorder, unspecified: Secondary | ICD-10-CM | POA: Diagnosis not present

## 2023-06-09 DIAGNOSIS — F419 Anxiety disorder, unspecified: Secondary | ICD-10-CM | POA: Diagnosis not present

## 2023-06-15 ENCOUNTER — Other Ambulatory Visit: Payer: Self-pay | Admitting: Family Medicine

## 2023-06-15 DIAGNOSIS — T7840XA Allergy, unspecified, initial encounter: Secondary | ICD-10-CM

## 2023-06-15 DIAGNOSIS — J4520 Mild intermittent asthma, uncomplicated: Secondary | ICD-10-CM

## 2023-06-23 DIAGNOSIS — F419 Anxiety disorder, unspecified: Secondary | ICD-10-CM | POA: Diagnosis not present

## 2023-06-30 DIAGNOSIS — F419 Anxiety disorder, unspecified: Secondary | ICD-10-CM | POA: Diagnosis not present

## 2023-07-07 DIAGNOSIS — F419 Anxiety disorder, unspecified: Secondary | ICD-10-CM | POA: Diagnosis not present

## 2023-07-14 DIAGNOSIS — F419 Anxiety disorder, unspecified: Secondary | ICD-10-CM | POA: Diagnosis not present

## 2023-07-16 DIAGNOSIS — F419 Anxiety disorder, unspecified: Secondary | ICD-10-CM | POA: Diagnosis not present

## 2023-07-21 DIAGNOSIS — F419 Anxiety disorder, unspecified: Secondary | ICD-10-CM | POA: Diagnosis not present

## 2023-07-28 DIAGNOSIS — F419 Anxiety disorder, unspecified: Secondary | ICD-10-CM | POA: Diagnosis not present

## 2023-08-07 DIAGNOSIS — F419 Anxiety disorder, unspecified: Secondary | ICD-10-CM | POA: Diagnosis not present

## 2023-08-18 DIAGNOSIS — F419 Anxiety disorder, unspecified: Secondary | ICD-10-CM | POA: Diagnosis not present

## 2023-08-26 DIAGNOSIS — F419 Anxiety disorder, unspecified: Secondary | ICD-10-CM | POA: Diagnosis not present

## 2023-08-31 DIAGNOSIS — F419 Anxiety disorder, unspecified: Secondary | ICD-10-CM | POA: Diagnosis not present

## 2023-09-01 DIAGNOSIS — F419 Anxiety disorder, unspecified: Secondary | ICD-10-CM | POA: Diagnosis not present

## 2023-09-08 DIAGNOSIS — F419 Anxiety disorder, unspecified: Secondary | ICD-10-CM | POA: Diagnosis not present

## 2023-09-15 DIAGNOSIS — F419 Anxiety disorder, unspecified: Secondary | ICD-10-CM | POA: Diagnosis not present

## 2023-09-22 DIAGNOSIS — F419 Anxiety disorder, unspecified: Secondary | ICD-10-CM | POA: Diagnosis not present

## 2023-09-26 DIAGNOSIS — F419 Anxiety disorder, unspecified: Secondary | ICD-10-CM | POA: Diagnosis not present

## 2023-09-29 DIAGNOSIS — F419 Anxiety disorder, unspecified: Secondary | ICD-10-CM | POA: Diagnosis not present

## 2023-10-11 ENCOUNTER — Ambulatory Visit (INDEPENDENT_AMBULATORY_CARE_PROVIDER_SITE_OTHER): Payer: BC Managed Care – PPO | Admitting: Family Medicine

## 2023-10-11 ENCOUNTER — Other Ambulatory Visit: Payer: Self-pay

## 2023-10-11 ENCOUNTER — Encounter: Payer: Self-pay | Admitting: Family Medicine

## 2023-10-11 VITALS — BP 105/62 | HR 72 | Temp 98.0°F | Resp 16 | Ht 64.0 in | Wt 162.0 lb

## 2023-10-11 DIAGNOSIS — Z0000 Encounter for general adult medical examination without abnormal findings: Secondary | ICD-10-CM | POA: Diagnosis not present

## 2023-10-11 DIAGNOSIS — J4520 Mild intermittent asthma, uncomplicated: Secondary | ICD-10-CM | POA: Diagnosis not present

## 2023-10-11 DIAGNOSIS — G43809 Other migraine, not intractable, without status migrainosus: Secondary | ICD-10-CM

## 2023-10-11 DIAGNOSIS — E871 Hypo-osmolality and hyponatremia: Secondary | ICD-10-CM

## 2023-10-11 LAB — CBC
HCT: 41.8 % (ref 36.0–46.0)
Hemoglobin: 14 g/dL (ref 12.0–15.0)
MCHC: 33.4 g/dL (ref 30.0–36.0)
MCV: 94.1 fL (ref 78.0–100.0)
Platelets: 279 10*3/uL (ref 150.0–400.0)
RBC: 4.45 Mil/uL (ref 3.87–5.11)
RDW: 13.6 % (ref 11.5–15.5)
WBC: 5.8 10*3/uL (ref 4.0–10.5)

## 2023-10-11 LAB — LIPID PANEL
Cholesterol: 157 mg/dL (ref 0–200)
HDL: 52.9 mg/dL (ref >39.00)
LDL Cholesterol: 91 mg/dL (ref 0–99)
NonHDL: 103.93
Total CHOL/HDL Ratio: 3
Triglycerides: 67 mg/dL (ref 0.0–149.0)
VLDL: 13.4 mg/dL (ref 0.0–40.0)

## 2023-10-11 LAB — COMPREHENSIVE METABOLIC PANEL
ALT: 13 U/L (ref 0–35)
AST: 17 U/L (ref 0–37)
Albumin: 4.1 g/dL (ref 3.5–5.2)
Alkaline Phosphatase: 76 U/L (ref 39–117)
BUN: 6 mg/dL (ref 6–23)
CO2: 24 meq/L (ref 19–32)
Calcium: 9.1 mg/dL (ref 8.4–10.5)
Chloride: 103 meq/L (ref 96–112)
Creatinine, Ser: 0.86 mg/dL (ref 0.40–1.20)
GFR: 84.63 mL/min (ref >60.00)
Glucose, Bld: 84 mg/dL (ref 70–99)
Potassium: 4.1 meq/L (ref 3.5–5.1)
Sodium: 134 meq/L — ABNORMAL LOW (ref 135–145)
Total Bilirubin: 0.7 mg/dL (ref 0.2–1.2)
Total Protein: 7.3 g/dL (ref 6.0–8.3)

## 2023-10-11 MED ORDER — SUMATRIPTAN SUCCINATE 100 MG PO TABS: 100.0000 mg | ORAL_TABLET | Freq: Once | ORAL | 3 refills | Status: DC | PRN

## 2023-10-11 MED ORDER — ALBUTEROL SULFATE HFA 108 (90 BASE) MCG/ACT IN AERS: 2.0000 | INHALATION_SPRAY | Freq: Four times a day (QID) | RESPIRATORY_TRACT | 2 refills | Status: AC | PRN

## 2023-10-11 NOTE — Patient Instructions (Signed)
Give us 2-3 business days to get the results of your labs back.   Keep the diet clean and stay active.  Please get me a copy of your advanced directive form at your convenience.   Let us know if you need anything.  

## 2023-10-11 NOTE — Progress Notes (Signed)
Chief Complaint  Patient presents with   Annual Exam    Annual Exam     [name] is here for a complete physical.   Her last physical was >1 year ago.  Current diet: in general, a "healthy" diet. Current exercise: cardio, lifting wts. Weight is stable and she denies fatigue out of ordinary. Seatbelt? Yes Advanced directive? No  Health Maintenance Pap/HPV- Yes Mammogram- No Tetanus- Yes Hep C screening- Yes HIV screening- Yes  Past Medical History:  Diagnosis Date   ALLERGIC RHINITIS    ALLERGY, INGESTED FOOD, DERMATITIS    ASTHMA    Asthma    Irritable bowel syndrome    MIGRAINE HEADACHE    PCOS (polycystic ovarian syndrome)      Past Surgical History:  Procedure Laterality Date   GYNECOLOGIC CRYOSURGERY      Medications  Current Outpatient Medications on File Prior to Visit  Medication Sig Dispense Refill   albuterol (VENTOLIN HFA) 108 (90 Base) MCG/ACT inhaler Inhale 2 puffs into the lungs every 6 (six) hours as needed for wheezing or shortness of breath. 18 g 2   montelukast (SINGULAIR) 10 MG tablet TAKE 1 TABLET(10 MG) BY MOUTH AT BEDTIME 30 tablet 3   Prenatal Vit-Fe Fumarate-FA (PRENATAL MULTIVITAMIN) TABS tablet Take 1 tablet by mouth daily at 12 noon.     SUMAtriptan (IMITREX) 100 MG tablet Take 1 tablet (100 mg total) by mouth once as needed for up to 1 dose for migraine. May repeat in 2 hours if needed. 10 tablet 3   Allergies Allergies  Allergen Reactions   Penicillins Rash    Review of Systems: Constitutional:  no unexpected weight changes Eye:  no recent significant change in vision Ear/Nose/Mouth/Throat:  Ears:  no recent change in hearing Nose/Mouth/Throat:  no complaints of nasal congestion, no sore throat Cardiovascular: no chest pain Respiratory:  no shortness of breath Gastrointestinal:  no abdominal pain, no change in bowel habits GU:  Female: negative for dysuria or pelvic pain Musculoskeletal/Extremities:  no pain of  the joints Integumentary (Skin/Breast):  no abnormal skin lesions reported Neurologic:  no headaches Endocrine:  denies fatigue Hematologic/Lymphatic:  No areas of easy bleeding  Exam BP 105/62 (BP Location: Left Arm, Patient Position: Sitting, Cuff Size: Normal)   Pulse 72   Temp 98 F (36.7 C) (Oral)   Resp 16   Ht 5\' 4"  (1.626 m)   Wt 162 lb (73.5 kg)   SpO2 98%   BMI 27.81 kg/m  General:  well developed, well nourished, in no apparent distress Skin:  no significant moles, warts, or growths Head:  no masses, lesions, or tenderness Eyes:  pupils equal and round, sclera anicteric without injection Ears:  canals without lesions, TMs shiny without retraction, no obvious effusion, no erythema Nose:  nares patent, mucosa normal, and no drainage Throat/Pharynx:  lips and gingiva without lesion; tongue and uvula midline; non-inflamed pharynx; no exudates or postnasal drainage Neck: neck supple without adenopathy, thyromegaly, or masses Lungs:  clear to auscultation, breath sounds equal bilaterally, no respiratory distress Cardio:  regular rate and rhythm, no LE edema Abdomen:  abdomen soft, nontender; bowel sounds normal; no masses or organomegaly Genital: Defer to GYN Musculoskeletal:  symmetrical muscle groups noted without atrophy or deformity Extremities:  no clubbing, cyanosis, or edema, no deformities, no skin discoloration Neuro:  gait normal; deep tendon reflexes normal and symmetric Psych: well oriented with normal range of affect and appropriate judgment/insight  Assessment and Plan  Well  adult exam - Plan: CBC, Comprehensive metabolic panel, Lipid panel  Other migraine without status migrainosus, not intractable - Plan: SUMAtriptan (IMITREX) 100 MG tablet  Mild intermittent asthma without complication - Plan: albuterol (VENTOLIN HFA) 108 (90 Base) MCG/ACT inhaler   Well 42 y.o. female. Counseled on diet and exercise. Advanced directive form provided today.  Other  orders as above. Flu shot politely declined.  Discussed mammogram, she has appt w GYN next mo. Will discuss with them.  Follow up in 1 yr. The patient voiced understanding and agreement to the plan.  [name] [place], DO 10/11/23 10:21 AM

## 2023-10-13 DIAGNOSIS — F419 Anxiety disorder, unspecified: Secondary | ICD-10-CM | POA: Diagnosis not present

## 2023-10-14 DIAGNOSIS — F419 Anxiety disorder, unspecified: Secondary | ICD-10-CM | POA: Diagnosis not present

## 2023-10-20 ENCOUNTER — Other Ambulatory Visit (INDEPENDENT_AMBULATORY_CARE_PROVIDER_SITE_OTHER): Payer: BC Managed Care – PPO

## 2023-10-20 DIAGNOSIS — N912 Amenorrhea, unspecified: Secondary | ICD-10-CM | POA: Diagnosis not present

## 2023-10-20 DIAGNOSIS — E871 Hypo-osmolality and hyponatremia: Secondary | ICD-10-CM

## 2023-10-20 DIAGNOSIS — F419 Anxiety disorder, unspecified: Secondary | ICD-10-CM | POA: Diagnosis not present

## 2023-10-20 LAB — HEPATITIS C ANTIBODY: HCV Ab: NEGATIVE

## 2023-10-20 LAB — OB RESULTS CONSOLE ANTIBODY SCREEN: Antibody Screen: NEGATIVE

## 2023-10-20 LAB — BASIC METABOLIC PANEL
BUN: 8 mg/dL (ref 6–23)
CO2: 25 meq/L (ref 19–32)
Calcium: 8.9 mg/dL (ref 8.4–10.5)
Chloride: 105 meq/L (ref 96–112)
Creatinine, Ser: 0.88 mg/dL (ref 0.40–1.20)
GFR: 82.31 mL/min (ref >60.00)
Glucose, Bld: 87 mg/dL (ref 70–99)
Potassium: 3.9 meq/L (ref 3.5–5.1)
Sodium: 136 meq/L (ref 135–145)

## 2023-10-20 LAB — OB RESULTS CONSOLE RPR: RPR: NONREACTIVE

## 2023-10-20 LAB — OB RESULTS CONSOLE HEPATITIS B SURFACE ANTIGEN: Hepatitis B Surface Ag: NEGATIVE

## 2023-10-20 LAB — OB RESULTS CONSOLE HIV ANTIBODY (ROUTINE TESTING): HIV: NONREACTIVE

## 2023-10-20 LAB — OB RESULTS CONSOLE RUBELLA ANTIBODY, IGM: Rubella: IMMUNE

## 2023-10-21 DIAGNOSIS — Z3200 Encounter for pregnancy test, result unknown: Secondary | ICD-10-CM | POA: Diagnosis not present

## 2023-10-27 DIAGNOSIS — F419 Anxiety disorder, unspecified: Secondary | ICD-10-CM | POA: Diagnosis not present

## 2023-11-05 DIAGNOSIS — F419 Anxiety disorder, unspecified: Secondary | ICD-10-CM | POA: Diagnosis not present

## 2023-11-17 DIAGNOSIS — Z3481 Encounter for supervision of other normal pregnancy, first trimester: Secondary | ICD-10-CM | POA: Diagnosis not present

## 2023-11-17 DIAGNOSIS — Z3401 Encounter for supervision of normal first pregnancy, first trimester: Secondary | ICD-10-CM | POA: Diagnosis not present

## 2023-11-17 DIAGNOSIS — F419 Anxiety disorder, unspecified: Secondary | ICD-10-CM | POA: Diagnosis not present

## 2023-11-22 DIAGNOSIS — Z3481 Encounter for supervision of other normal pregnancy, first trimester: Secondary | ICD-10-CM | POA: Diagnosis not present

## 2023-11-22 DIAGNOSIS — Z124 Encounter for screening for malignant neoplasm of cervix: Secondary | ICD-10-CM | POA: Diagnosis not present

## 2023-11-22 DIAGNOSIS — Z1151 Encounter for screening for human papillomavirus (HPV): Secondary | ICD-10-CM | POA: Diagnosis not present

## 2023-11-22 DIAGNOSIS — O09529 Supervision of elderly multigravida, unspecified trimester: Secondary | ICD-10-CM | POA: Diagnosis not present

## 2023-11-22 DIAGNOSIS — O09521 Supervision of elderly multigravida, first trimester: Secondary | ICD-10-CM | POA: Diagnosis not present

## 2023-11-22 LAB — OB RESULTS CONSOLE GC/CHLAMYDIA
Chlamydia: NEGATIVE
Neisseria Gonorrhea: NEGATIVE

## 2023-11-24 DIAGNOSIS — F419 Anxiety disorder, unspecified: Secondary | ICD-10-CM | POA: Diagnosis not present

## 2023-12-01 DIAGNOSIS — F419 Anxiety disorder, unspecified: Secondary | ICD-10-CM | POA: Diagnosis not present

## 2023-12-14 DIAGNOSIS — O209 Hemorrhage in early pregnancy, unspecified: Secondary | ICD-10-CM | POA: Diagnosis not present

## 2023-12-14 DIAGNOSIS — O09529 Supervision of elderly multigravida, unspecified trimester: Secondary | ICD-10-CM | POA: Diagnosis not present

## 2023-12-22 DIAGNOSIS — F419 Anxiety disorder, unspecified: Secondary | ICD-10-CM | POA: Diagnosis not present

## 2023-12-27 DIAGNOSIS — Z361 Encounter for antenatal screening for raised alphafetoprotein level: Secondary | ICD-10-CM | POA: Diagnosis not present

## 2023-12-27 DIAGNOSIS — O09522 Supervision of elderly multigravida, second trimester: Secondary | ICD-10-CM | POA: Diagnosis not present

## 2023-12-27 DIAGNOSIS — O09529 Supervision of elderly multigravida, unspecified trimester: Secondary | ICD-10-CM | POA: Diagnosis not present

## 2023-12-27 DIAGNOSIS — Z3482 Encounter for supervision of other normal pregnancy, second trimester: Secondary | ICD-10-CM | POA: Diagnosis not present

## 2024-01-12 DIAGNOSIS — F419 Anxiety disorder, unspecified: Secondary | ICD-10-CM | POA: Diagnosis not present

## 2024-01-19 DIAGNOSIS — F419 Anxiety disorder, unspecified: Secondary | ICD-10-CM | POA: Diagnosis not present

## 2024-01-19 DIAGNOSIS — O09522 Supervision of elderly multigravida, second trimester: Secondary | ICD-10-CM | POA: Diagnosis not present

## 2024-01-19 DIAGNOSIS — Z3689 Encounter for other specified antenatal screening: Secondary | ICD-10-CM | POA: Diagnosis not present

## 2024-01-26 DIAGNOSIS — F419 Anxiety disorder, unspecified: Secondary | ICD-10-CM | POA: Diagnosis not present

## 2024-02-16 DIAGNOSIS — F419 Anxiety disorder, unspecified: Secondary | ICD-10-CM | POA: Diagnosis not present

## 2024-02-17 DIAGNOSIS — O09522 Supervision of elderly multigravida, second trimester: Secondary | ICD-10-CM | POA: Diagnosis not present

## 2024-02-28 DIAGNOSIS — F419 Anxiety disorder, unspecified: Secondary | ICD-10-CM | POA: Diagnosis not present

## 2024-03-16 DIAGNOSIS — O09522 Supervision of elderly multigravida, second trimester: Secondary | ICD-10-CM | POA: Diagnosis not present

## 2024-03-28 DIAGNOSIS — O09529 Supervision of elderly multigravida, unspecified trimester: Secondary | ICD-10-CM | POA: Diagnosis not present

## 2024-03-28 DIAGNOSIS — Z3483 Encounter for supervision of other normal pregnancy, third trimester: Secondary | ICD-10-CM | POA: Diagnosis not present

## 2024-03-28 DIAGNOSIS — Z3689 Encounter for other specified antenatal screening: Secondary | ICD-10-CM | POA: Diagnosis not present

## 2024-03-28 DIAGNOSIS — Z362 Encounter for other antenatal screening follow-up: Secondary | ICD-10-CM | POA: Diagnosis not present

## 2024-03-29 DIAGNOSIS — F419 Anxiety disorder, unspecified: Secondary | ICD-10-CM | POA: Diagnosis not present

## 2024-04-04 DIAGNOSIS — M5489 Other dorsalgia: Secondary | ICD-10-CM | POA: Diagnosis not present

## 2024-04-12 DIAGNOSIS — F419 Anxiety disorder, unspecified: Secondary | ICD-10-CM | POA: Diagnosis not present

## 2024-04-13 DIAGNOSIS — F419 Anxiety disorder, unspecified: Secondary | ICD-10-CM | POA: Diagnosis not present

## 2024-04-13 DIAGNOSIS — O09523 Supervision of elderly multigravida, third trimester: Secondary | ICD-10-CM | POA: Diagnosis not present

## 2024-04-14 DIAGNOSIS — F419 Anxiety disorder, unspecified: Secondary | ICD-10-CM | POA: Diagnosis not present

## 2024-04-19 DIAGNOSIS — F419 Anxiety disorder, unspecified: Secondary | ICD-10-CM | POA: Diagnosis not present

## 2024-04-26 DIAGNOSIS — O09529 Supervision of elderly multigravida, unspecified trimester: Secondary | ICD-10-CM | POA: Diagnosis not present

## 2024-04-26 DIAGNOSIS — F419 Anxiety disorder, unspecified: Secondary | ICD-10-CM | POA: Diagnosis not present

## 2024-04-27 DIAGNOSIS — F419 Anxiety disorder, unspecified: Secondary | ICD-10-CM | POA: Diagnosis not present

## 2024-05-04 DIAGNOSIS — F419 Anxiety disorder, unspecified: Secondary | ICD-10-CM | POA: Diagnosis not present

## 2024-05-11 DIAGNOSIS — Z3483 Encounter for supervision of other normal pregnancy, third trimester: Secondary | ICD-10-CM | POA: Diagnosis not present

## 2024-05-18 DIAGNOSIS — Z3685 Encounter for antenatal screening for Streptococcus B: Secondary | ICD-10-CM | POA: Diagnosis not present

## 2024-05-18 DIAGNOSIS — O09523 Supervision of elderly multigravida, third trimester: Secondary | ICD-10-CM | POA: Diagnosis not present

## 2024-05-18 DIAGNOSIS — O09529 Supervision of elderly multigravida, unspecified trimester: Secondary | ICD-10-CM | POA: Diagnosis not present

## 2024-05-18 DIAGNOSIS — O26893 Other specified pregnancy related conditions, third trimester: Secondary | ICD-10-CM | POA: Diagnosis not present

## 2024-05-18 DIAGNOSIS — Z3483 Encounter for supervision of other normal pregnancy, third trimester: Secondary | ICD-10-CM | POA: Diagnosis not present

## 2024-05-18 LAB — OB RESULTS CONSOLE GBS: GBS: NEGATIVE

## 2024-05-26 DIAGNOSIS — O09529 Supervision of elderly multigravida, unspecified trimester: Secondary | ICD-10-CM | POA: Diagnosis not present

## 2024-06-01 DIAGNOSIS — F419 Anxiety disorder, unspecified: Secondary | ICD-10-CM | POA: Diagnosis not present

## 2024-06-01 DIAGNOSIS — O09523 Supervision of elderly multigravida, third trimester: Secondary | ICD-10-CM | POA: Diagnosis not present

## 2024-06-07 DIAGNOSIS — Z30014 Encounter for initial prescription of intrauterine contraceptive device: Secondary | ICD-10-CM | POA: Diagnosis not present

## 2024-06-08 DIAGNOSIS — O09523 Supervision of elderly multigravida, third trimester: Secondary | ICD-10-CM | POA: Diagnosis not present

## 2024-06-15 ENCOUNTER — Inpatient Hospital Stay (HOSPITAL_BASED_OUTPATIENT_CLINIC_OR_DEPARTMENT_OTHER)

## 2024-06-15 ENCOUNTER — Other Ambulatory Visit: Payer: Self-pay

## 2024-06-15 ENCOUNTER — Inpatient Hospital Stay (HOSPITAL_COMMUNITY)
Admission: AD | Admit: 2024-06-15 | Discharge: 2024-06-15 | Disposition: A | Discharge status: Home / Self Care | Source: Home / Self Care | Attending: Obstetrics and Gynecology | Admitting: Obstetrics and Gynecology

## 2024-06-15 ENCOUNTER — Encounter (HOSPITAL_COMMUNITY): Payer: Self-pay | Admitting: Obstetrics and Gynecology

## 2024-06-15 DIAGNOSIS — O289 Unspecified abnormal findings on antenatal screening of mother: Secondary | ICD-10-CM

## 2024-06-15 DIAGNOSIS — O368330 Maternal care for abnormalities of the fetal heart rate or rhythm, third trimester, not applicable or unspecified: Secondary | ICD-10-CM | POA: Diagnosis not present

## 2024-06-15 DIAGNOSIS — Z3A39 39 weeks gestation of pregnancy: Secondary | ICD-10-CM

## 2024-06-15 DIAGNOSIS — O480 Post-term pregnancy: Secondary | ICD-10-CM | POA: Diagnosis not present

## 2024-06-15 DIAGNOSIS — O288 Other abnormal findings on antenatal screening of mother: Secondary | ICD-10-CM | POA: Diagnosis not present

## 2024-06-15 DIAGNOSIS — O09523 Supervision of elderly multigravida, third trimester: Secondary | ICD-10-CM | POA: Diagnosis not present

## 2024-06-15 DIAGNOSIS — O43813 Placental infarction, third trimester: Secondary | ICD-10-CM | POA: Diagnosis not present

## 2024-06-15 DIAGNOSIS — Z3A40 40 weeks gestation of pregnancy: Secondary | ICD-10-CM | POA: Diagnosis not present

## 2024-06-15 DIAGNOSIS — Z8249 Family history of ischemic heart disease and other diseases of the circulatory system: Secondary | ICD-10-CM | POA: Diagnosis not present

## 2024-06-15 DIAGNOSIS — Z3A00 Weeks of gestation of pregnancy not specified: Secondary | ICD-10-CM | POA: Diagnosis not present

## 2024-06-15 DIAGNOSIS — O26893 Other specified pregnancy related conditions, third trimester: Secondary | ICD-10-CM | POA: Diagnosis not present

## 2024-06-15 NOTE — Discharge Instructions (Signed)
 Labor precautions

## 2024-06-15 NOTE — MAU Provider Note (Signed)
 History     Chief Complaint  Patient presents with   failed NST    43  yo G2P0010 BF @ 39 5/[redacted] weeks gestation sent from the office  for further testing due to NR NST OB History     Gravida  2   Para      Term      Preterm      AB  1   Living         SAB  1   IAB      Ectopic      Multiple      Live Births              Past Medical History:  Diagnosis Date   ALLERGIC RHINITIS    ALLERGY , INGESTED FOOD, DERMATITIS    ASTHMA    Asthma    Irritable bowel syndrome    MIGRAINE HEADACHE    PCOS (polycystic ovarian syndrome)     Past Surgical History:  Procedure Laterality Date   GYNECOLOGIC CRYOSURGERY      Family History  Problem Relation Age of Onset   Arthritis Mother    Eczema Mother    Hyperlipidemia Father    Hypertension Father    Arthritis Maternal Grandmother    Cancer Maternal Grandmother    Allergic rhinitis Maternal Grandmother    Asthma Maternal Grandmother    Arthritis Paternal Grandmother    Heart disease Paternal Grandmother    Liver disease Paternal Grandfather    Eczema Sister    Asthma Sister    Angioedema Neg Hx    Immunodeficiency Neg Hx    Urticaria Neg Hx     Social History   Tobacco Use   Smoking status: Never   Smokeless tobacco: Never   Tobacco comments:    Single-has boyfriend. Employed Architectural technologist at [employer] status: Never Used  Substance Use Topics   Alcohol use: Yes    Comment: Occ.   Drug use: No    Allergies:  Allergies  Allergen Reactions   Penicillins Rash    Medications Prior to Admission  Medication Sig Dispense Refill Last Dose/Taking   aspirin 81 MG chewable tablet Chew by mouth daily.   06/14/2024   montelukast  (SINGULAIR ) 10 MG tablet TAKE 1 TABLET(10 MG) BY MOUTH AT BEDTIME 30 tablet 3 06/14/2024   pantoprazole (PROTONIX) 20 MG tablet Take 20 mg by mouth daily.   06/14/2024   Prenatal Vit-Fe Fumarate-FA (PRENATAL MULTIVITAMIN) TABS tablet Take 1  tablet by mouth daily at 12 noon.   06/14/2024   albuterol  (VENTOLIN  HFA) 108 (90 Base) MCG/ACT inhaler Inhale 2 puffs into the lungs every 6 (six) hours as needed for wheezing or shortness of breath. 18 g 2    SUMAtriptan  (IMITREX ) 100 MG tablet Take 1 tablet (100 mg total) by mouth once as needed for up to 1 dose for migraine. May repeat in 2 hours if needed. 10 tablet 3      Physical Exam   Blood pressure 113/71, pulse 83, resp. rate 16, height 5' 4 (1.626 m), weight 89.2 kg, SpO2 100%.  No exam performed today, done at office.   NST:  Tracing: baseline 150-155 (+) accel to 170 good variability No ctx  US  MFM Fetal BPP Wo Non Stress Result Date: 06/15/2024 ----------------------------------------------------------------------  OBSTETRICS REPORT                        (  Signed Final 06/15/2024 02:28 pm) ---------------------------------------------------------------------- Patient Info  ID #:       [id number]                          D.O.B.:  10-Jun-1981 (42 yrs)(F)  Name:       Christine Morgan                  Visit Date: 06/15/2024 11:26 am ---------------------------------------------------------------------- Performed By  Attending:        Nathanel Fetters      Ref. Address:      Anice Pang                    MD                                                              OB/GYN                                                              [address]                                                              [place]                                                              [id number]  Performed By:     Burnard LITTIE Custard          Secondary Phy.:    [facility] MAU/Triage                    RDMS  Referred By:      DICKIE             [facility]                    Leobardo Granlund MD                                [facility] ---------------------------------------------------------------------- Orders  #   Description  Code        Ordered By  1  US  MFM FETAL BPP WO NON               76819.01    Kimie Pidcock     STRESS                                            Alexus Galka ----------------------------------------------------------------------  #  Order #                     Accession #                Episode #  1  [id number]                   [id number]                 [id number] ---------------------------------------------------------------------- Indications  Non-reactive NST                                O28.9  [redacted] weeks gestation of pregnancy                 Z3A.39 ---------------------------------------------------------------------- Fetal Evaluation  Num Of Fetuses:          1  Fetal Heart Rate(bpm):   162  Cardiac Activity:        Observed  Presentation:            Cephalic  Placenta:                Posterior  Amniotic Fluid  AFI FV:      Within normal limits  AFI Sum(cm)     %Tile       Largest Pocket(cm)  16.6            70          5.4  RUQ(cm)       RLQ(cm)       LUQ(cm)        LLQ(cm)  5.4           3.7           3.3            4.2 ---------------------------------------------------------------------- Biophysical Evaluation  Amniotic F.V:   Within normal limits       F. Tone:         Observed  F. Movement:    Observed                   Score:           8/8  F. Breathing:   Observed ---------------------------------------------------------------------- OB History  Gravidity:    2         Term:   0        Prem:   0        SAB:   1  TOP:          0       Ectopic:  0        Living: 0 ---------------------------------------------------------------------- Gestational Age  Clinical EDD:  39w 5d  EDD:   06/17/24  Best:          39w 5d     Det. By:  Clinical EDD             EDD:   06/17/24 ---------------------------------------------------------------------- Anatomy  Diaphragm:             Appears normal         Kidneys:                Appear normal  Stomach:                Appears normal, left   Bladder:                Appears normal                         sided ---------------------------------------------------------------------- Impression  Antenatal testing performed given non-reactive NST  The biophysical profile was 8/8 with good fetal movement and  amniotic fluid volume. ---------------------------------------------------------------------- Recommendations  Clinical correlation recommended. ----------------------------------------------------------------------              Nathanel Fetters, MD Electronically Signed Final Report   06/15/2024 02:28 pm ----------------------------------------------------------------------     ED Course   NR NST AMA IUP @ 39 5/7 wk P) BPP done. Reassuring fetal testing. Labor precautions. IOL 8/9 MDM    Dickie DELENA Carder, MD 12:42 PM 06/15/2024

## 2024-06-15 NOTE — MAU Note (Signed)
 Christine Morgan is a 43 y.o. at [redacted]w[redacted]d here in MAU reporting: failed NST in office- here for repeat. NO c/o SROM, vaginal bleeding, contractions, HA, chest pain, or visual disturbances. EFM explained and applied to soft non tender abd. Physical begun.  +FM   LMP:  Onset of complaint: today Pain score: 3/10   VSS FHT: 165  Lab orders placed from triage: none

## 2024-06-16 ENCOUNTER — Encounter (HOSPITAL_COMMUNITY): Payer: Self-pay | Admitting: *Deleted

## 2024-06-16 ENCOUNTER — Telehealth (HOSPITAL_COMMUNITY): Payer: Self-pay | Admitting: *Deleted

## 2024-06-16 NOTE — Telephone Encounter (Signed)
 Preadmission screen

## 2024-06-17 ENCOUNTER — Inpatient Hospital Stay (HOSPITAL_COMMUNITY): Admitting: Anesthesiology

## 2024-06-17 ENCOUNTER — Other Ambulatory Visit: Payer: Self-pay

## 2024-06-17 ENCOUNTER — Encounter (HOSPITAL_COMMUNITY): Payer: Self-pay | Admitting: Obstetrics and Gynecology

## 2024-06-17 ENCOUNTER — Inpatient Hospital Stay (HOSPITAL_COMMUNITY)

## 2024-06-17 ENCOUNTER — Inpatient Hospital Stay (HOSPITAL_COMMUNITY)
Admission: RE | Admit: 2024-06-17 | Discharge: 2024-06-20 | DRG: 788 | Disposition: A | Discharge status: Home / Self Care | Attending: Obstetrics and Gynecology | Admitting: Obstetrics and Gynecology

## 2024-06-17 DIAGNOSIS — Z8249 Family history of ischemic heart disease and other diseases of the circulatory system: Secondary | ICD-10-CM | POA: Diagnosis not present

## 2024-06-17 DIAGNOSIS — O26893 Other specified pregnancy related conditions, third trimester: Secondary | ICD-10-CM | POA: Diagnosis present

## 2024-06-17 DIAGNOSIS — O34219 Maternal care for unspecified type scar from previous cesarean delivery: Secondary | ICD-10-CM | POA: Diagnosis present

## 2024-06-17 DIAGNOSIS — O09523 Supervision of elderly multigravida, third trimester: Principal | ICD-10-CM | POA: Diagnosis present

## 2024-06-17 DIAGNOSIS — Z98891 History of uterine scar from previous surgery: Principal | ICD-10-CM

## 2024-06-17 DIAGNOSIS — Z3A40 40 weeks gestation of pregnancy: Secondary | ICD-10-CM | POA: Diagnosis not present

## 2024-06-17 LAB — CBC
HCT: 38.3 % (ref 36.0–46.0)
Hemoglobin: 12.9 g/dL (ref 12.0–15.0)
MCH: 31.2 pg (ref 26.0–34.0)
MCHC: 33.7 g/dL (ref 30.0–36.0)
MCV: 92.5 fL (ref 80.0–100.0)
Platelets: 316 K/uL (ref 150–400)
RBC: 4.14 MIL/uL (ref 3.87–5.11)
RDW: 15 % (ref 11.5–15.5)
WBC: 9.3 K/uL (ref 4.0–10.5)
nRBC: 0 % (ref 0.0–0.2)

## 2024-06-17 LAB — TYPE AND SCREEN
ABO/RH(D): O POS
Antibody Screen: NEGATIVE

## 2024-06-17 MED ORDER — LACTATED RINGERS IV SOLN: 500.0000 mL | INTRAVENOUS | Status: DC | PRN

## 2024-06-17 MED ORDER — ACETAMINOPHEN 325 MG PO TABS
650.0000 mg | ORAL_TABLET | ORAL | Status: DC | PRN
Start: 2024-06-17 — End: 2024-06-18

## 2024-06-17 MED ORDER — PHENYLEPHRINE 80 MCG/ML (10ML) SYRINGE FOR IV PUSH (FOR BLOOD PRESSURE SUPPORT)
80.0000 ug | PREFILLED_SYRINGE | INTRAVENOUS | Status: DC | PRN
  Filled 2024-06-17: qty 10

## 2024-06-17 MED ORDER — OXYTOCIN 10 UNIT/ML IJ SOLN: 10.0000 [IU] | Freq: Once | INTRAMUSCULAR | Status: DC

## 2024-06-17 MED ORDER — ONDANSETRON HCL 4 MG/2ML IJ SOLN
4.0000 mg | Freq: Four times a day (QID) | INTRAMUSCULAR | Status: DC | PRN
  Administered 2024-06-17: 4 mg via INTRAVENOUS
  Filled 2024-06-17: qty 2

## 2024-06-17 MED ORDER — OXYTOCIN-SODIUM CHLORIDE 30-0.9 UT/500ML-% IV SOLN
1.0000 m[IU]/min | INTRAVENOUS | Status: DC
  Administered 2024-06-17: 2 m[IU]/min via INTRAVENOUS
  Filled 2024-06-17: qty 500

## 2024-06-17 MED ORDER — FENTANYL CITRATE (PF) 100 MCG/2ML IJ SOLN: 100.0000 ug | INTRAMUSCULAR | Status: DC | PRN

## 2024-06-17 MED ORDER — FENTANYL-BUPIVACAINE-NACL 0.5-0.125-0.9 MG/250ML-% EP SOLN
12.0000 mL/h | EPIDURAL | Status: DC | PRN
  Administered 2024-06-17: 12 mL/h via EPIDURAL
  Filled 2024-06-17: qty 250

## 2024-06-17 MED ORDER — LACTATED RINGERS IV SOLN: 500.0000 mL | Freq: Once | INTRAVENOUS | Status: DC

## 2024-06-17 MED ORDER — LIDOCAINE-EPINEPHRINE (PF) 1.5 %-1:200000 IJ SOLN
INTRAMUSCULAR | Status: DC | PRN
Start: 2024-06-17 — End: 2024-06-18
  Administered 2024-06-17: 3 mL via EPIDURAL

## 2024-06-17 MED ORDER — SOD CITRATE-CITRIC ACID 500-334 MG/5ML PO SOLN
30.0000 mL | ORAL | Status: DC | PRN
Start: 2024-06-17 — End: 2024-06-18
  Administered 2024-06-18: 30 mL via ORAL
  Filled 2024-06-17: qty 30

## 2024-06-17 MED ORDER — EPHEDRINE 5 MG/ML INJ: 10.0000 mg | INTRAVENOUS | Status: DC | PRN

## 2024-06-17 MED ORDER — LIDOCAINE HCL (PF) 1 % IJ SOLN: 30.0000 mL | INTRAMUSCULAR | Status: DC | PRN

## 2024-06-17 MED ORDER — OXYTOCIN-SODIUM CHLORIDE 30-0.9 UT/500ML-% IV SOLN: 2.5000 [IU]/h | INTRAVENOUS | Status: DC

## 2024-06-17 MED ORDER — OXYTOCIN BOLUS FROM INFUSION: 333.0000 mL | Freq: Once | INTRAVENOUS | Status: DC

## 2024-06-17 MED ORDER — LACTATED RINGERS IV SOLN: INTRAVENOUS | Status: DC

## 2024-06-17 MED ORDER — OXYCODONE-ACETAMINOPHEN 5-325 MG PO TABS: 2.0000 | ORAL_TABLET | ORAL | Status: DC | PRN

## 2024-06-17 MED ORDER — PHENYLEPHRINE 80 MCG/ML (10ML) SYRINGE FOR IV PUSH (FOR BLOOD PRESSURE SUPPORT): 80.0000 ug | PREFILLED_SYRINGE | INTRAVENOUS | Status: DC | PRN

## 2024-06-17 MED ORDER — TERBUTALINE SULFATE 1 MG/ML IJ SOLN: 0.2500 mg | Freq: Once | INTRAMUSCULAR | Status: DC | PRN

## 2024-06-17 MED ORDER — OXYCODONE-ACETAMINOPHEN 5-325 MG PO TABS: 1.0000 | ORAL_TABLET | ORAL | Status: DC | PRN

## 2024-06-17 MED ORDER — DIPHENHYDRAMINE HCL 50 MG/ML IJ SOLN: 12.5000 mg | INTRAMUSCULAR | Status: DC | PRN

## 2024-06-17 NOTE — Anesthesia Preprocedure Evaluation (Signed)
 Anesthesia Evaluation  Patient identified by MRN, date of birth, ID band Patient awake    Reviewed: Allergy  & Precautions, Patient's Chart, lab work & pertinent test results  Airway Mallampati: II  TM Distance: >3 FB Neck ROM: Full    Dental no notable dental hx.    Pulmonary neg pulmonary ROS   Pulmonary exam normal breath sounds clear to auscultation       Cardiovascular negative cardio ROS Normal cardiovascular exam Rhythm:Regular Rate:Normal     Neuro/Psych negative neurological ROS     GI/Hepatic negative GI ROS, Neg liver ROS,,,  Endo/Other  negative endocrine ROS    Renal/GU negative Renal ROS     Musculoskeletal negative musculoskeletal ROS (+)    Abdominal   Peds  Hematology negative hematology ROS (+)   Anesthesia Other Findings   Reproductive/Obstetrics (+) Pregnancy                              Anesthesia Physical Anesthesia Plan  ASA: 2  Anesthesia Plan: Epidural   Post-op Pain Management:    Induction:   PONV Risk Score and Plan:   Airway Management Planned:   Additional Equipment:   Intra-op Plan:   Post-operative Plan:   Informed Consent: I have reviewed the patients History and Physical, chart, labs and discussed the procedure including the risks, benefits and alternatives for the proposed anesthesia with the patient or authorized representative who has indicated his/her understanding and acceptance.     Dental advisory given  Plan Discussed with: CRNA  Anesthesia Plan Comments:          Anesthesia Quick Evaluation

## 2024-06-17 NOTE — Anesthesia Procedure Notes (Signed)
 Epidural Patient location during procedure: OB Start time: 06/17/2024 11:13 PM End time: 06/17/2024 11:31 PM  Staffing Anesthesiologist: Darlyn Rush, MD Performed: anesthesiologist   Preanesthetic Checklist Completed: patient identified, IV checked, risks and benefits discussed, monitors and equipment checked, pre-op evaluation and timeout performed  Epidural Patient position: sitting Prep: DuraPrep and site prepped and draped Patient monitoring: heart rate, continuous pulse ox and blood pressure Approach: midline Location: L3-L4 Injection technique: LOR air and LOR saline  Needle:  Needle type: Tuohy  Needle gauge: 17 G Needle length: 9 cm Needle insertion depth: 7 cm Catheter type: closed end flexible Catheter size: 19 Gauge Catheter at skin depth: 13 cm Test dose: negative  Assessment Sensory level: T8 Events: blood not aspirated, no cerebrospinal fluid, injection not painful, no injection resistance, no paresthesia and negative IV test  Additional Notes Reason for block:procedure for pain

## 2024-06-17 NOTE — Progress Notes (Signed)
 S: pt seen at 10:05 pm  FHR noted to be 180 bpm RN called regarding tachycardia Per review of tracing.  Elevated FHR Appeared from  9:50 pm. Previous tracing baseline 165 (+) accel to 175  O: pitocin  14 miu         T 98.5  Abd gravid nontender VE 5/80/-2-1 AROM clear fluid  no odor IUPC placed   IMP: Fetal tachycardia w/o cause ( Afebrile, AROm now) AMA Term gestation P) IV fluid bolus 1000 ml , maternal right exaggerated sims

## 2024-06-17 NOTE — H&P (Signed)
 Christine Morgan is Christine 43 y.o. female presenting for IOL 2nd to [facility]. Uncomplicated labor. OB History     Gravida  2   Para      Term      Preterm      AB  1   Living         SAB  1   IAB      Ectopic      Multiple      Live Births             Past Medical History:  Diagnosis Date   ALLERGIC RHINITIS    ALLERGY , INGESTED FOOD, DERMATITIS    ASTHMA    Asthma    Irritable bowel syndrome    MIGRAINE HEADACHE    PCOS (polycystic ovarian syndrome)    Vaginal Pap smear, abnormal    Past Surgical History:  Procedure Laterality Date   GYNECOLOGIC CRYOSURGERY     Family History: family history includes Allergic rhinitis in her maternal grandmother; Arthritis in her maternal grandmother, mother, and paternal grandmother; Asthma in her maternal grandmother and sister; Cancer in her maternal grandmother; Eczema in her mother and sister; Heart disease in her paternal grandmother; Hyperlipidemia in her father; Hypertension in her father; Liver disease in her paternal grandfather. Social History:  reports that she has never smoked. She has never used smokeless tobacco. She reports current alcohol use. She reports that she does not use drugs.     Maternal Diabetes: No Genetic Screening: Normal Maternal Ultrasounds/Referrals: Normal Fetal Ultrasounds or other Referrals:  None Maternal Substance Abuse:  No Significant Maternal Medications:  None Significant Maternal Lab Results:  Group B Strep negative Number of Prenatal Visits:greater than 3 verified prenatal visits Maternal Vaccinations:TDap Other Comments:  None  Review of Systems  All other systems reviewed and are negative.  History Dilation: Closed Effacement (%): Thick Station: Ballotable Exam by:: Christine Lemmings RN Blood pressure 104/74, pulse 93, temperature 98 F (36.7 C), temperature source Oral, resp. rate 18, height 5' 4.5 (1.638 m), weight 89 kg, SpO2 100%. Maternal Exam:  Introitus: Normal vulva.    Physical Exam Constitutional:      Appearance: Normal appearance.  Eyes:     Extraocular Movements: Extraocular movements intact.  Cardiovascular:     Rate and Rhythm: Regular rhythm.     Heart sounds: Normal heart sounds.  Abdominal:     Comments: gravid  Genitourinary:    General: Normal vulva.     Comments: Closed/79/-2 Musculoskeletal:        General: Normal range of motion.     Cervical back: Neck supple.  Skin:    General: Skin is warm and dry.  Neurological:     General: No focal deficit present.     Mental Status: She is alert and oriented to person, place, and time.  Psychiatric:        Mood and Affect: Mood normal.        Behavior: Behavior normal.     Prenatal labs: ABO, Rh: --/--/PENDING (08/09 1300) Antibody: PENDING (08/09 1300) Rubella: Immune (12/11 0000) RPR: Nonreactive (12/11 0000)  HBsAg: Negative (12/11 0000)  HIV: Non-reactive (12/11 0000)  GBS: Negative/-- (07/10 0000)   Assessment/Plan: AMA  Term P) admit routine labs. IV pitocin . Intracervical balloon placed. Analgesic prn   Christine Morgan Christine Morgan 06/17/2024, 2:01 PM

## 2024-06-17 NOTE — Progress Notes (Signed)
 Christine Morgan is Christine 43 y.o. G2P0010 at [redacted]w[redacted]d by LMP admitted for induction of labor due to [facility].  Subjective: C/o pressure since AROM  Objective: BP 110/63   Pulse 89   Temp 99.5 F (37.5 C) (Axillary)   Resp 17   Ht 5' 4.5 (1.638 m)   Wt 89 kg   SpO2 100%   BMI 33.14 kg/m  Pitocin  18 miu  FHT:  FHR: 175 bpm, variability: moderate,  accelerations:  Present,  decelerations:  Absent UC:   regular, every 2 minutes SVE:   unchanged   Labs: Lab Results  Component Value Date   WBC 9.3 06/17/2024   HGB 12.9 06/17/2024   HCT 38.3 06/17/2024   MCV 92.5 06/17/2024   PLT 316 06/17/2024    Assessment / Plan: Fetal tachycardia AMA Term gestation S/p IV F bolus. Rpt temp 99,5. Pt advised c/s if persistent tachycardia P) d/c pitocin . Epidural in progress Anticipated MOD:  c/s if no change   Christine Morgan Christine Morgan 06/17/2024, 11:17 PM

## 2024-06-17 NOTE — Progress Notes (Signed)
 Christine Morgan is a 43 y.o. G2P0010 at [redacted]w[redacted]d by LMP admitted for induction of labor due to [facility].  Subjective: Some cramping Balloon in   Objective: Pitocin  10 miu BP 122/72   Pulse 71   Temp 98.6 F (37 C) (Oral)   Resp 18   Ht 5' 4.5 (1.638 m)   Wt 89 kg   SpO2 100%   BMI 33.14 kg/m  No intake/output data recorded. No intake/output data recorded.  FHT:  FHR: 155 bpm, variability: moderate,  accelerations:  Present,  decelerations:  Absent UC:   irregular, every 2-4 minutes SVE:   deferred Tracing: cat 1  Labs: Lab Results  Component Value Date   WBC 9.3 06/17/2024   HGB 12.9 06/17/2024   HCT 38.3 06/17/2024   MCV 92.5 06/17/2024   PLT 316 06/17/2024    Assessment / Plan: Latent phase Term gestation P) continue increase pitocin   Anticipated MOD:  NSVD  Christine Morgan 06/17/2024, 7:40 PM

## 2024-06-18 ENCOUNTER — Encounter (HOSPITAL_COMMUNITY): Payer: Self-pay | Admitting: Obstetrics and Gynecology

## 2024-06-18 ENCOUNTER — Encounter (HOSPITAL_COMMUNITY)
Admission: RE | Disposition: A | Discharge status: Home / Self Care | Payer: Self-pay | Source: Home / Self Care | Attending: Obstetrics and Gynecology

## 2024-06-18 ENCOUNTER — Other Ambulatory Visit: Payer: Self-pay

## 2024-06-18 DIAGNOSIS — O34219 Maternal care for unspecified type scar from previous cesarean delivery: Secondary | ICD-10-CM | POA: Diagnosis present

## 2024-06-18 LAB — RPR: RPR Ser Ql: NONREACTIVE

## 2024-06-18 SURGERY — Surgical Case
Anesthesia: Epidural

## 2024-06-18 MED ORDER — PHENYLEPHRINE HCL-NACL 20-0.9 MG/250ML-% IV SOLN
INTRAVENOUS | Status: AC
  Filled 2024-06-18: qty 750

## 2024-06-18 MED ORDER — KETOROLAC TROMETHAMINE 30 MG/ML IJ SOLN
30.0000 mg | Freq: Once | INTRAMUSCULAR | Status: AC | PRN
  Administered 2024-06-18: 30 mg via INTRAVENOUS

## 2024-06-18 MED ORDER — SODIUM CHLORIDE 0.9 % IR SOLN
Status: DC | PRN
  Administered 2024-06-18: 600 mL

## 2024-06-18 MED ORDER — ONDANSETRON HCL 4 MG/2ML IJ SOLN
INTRAMUSCULAR | Status: AC
  Filled 2024-06-18: qty 2

## 2024-06-18 MED ORDER — PHENYLEPHRINE 80 MCG/ML (10ML) SYRINGE FOR IV PUSH (FOR BLOOD PRESSURE SUPPORT)
PREFILLED_SYRINGE | INTRAVENOUS | Status: AC
  Filled 2024-06-18: qty 10

## 2024-06-18 MED ORDER — DEXMEDETOMIDINE HCL IN NACL 80 MCG/20ML IV SOLN
INTRAVENOUS | Status: AC
  Filled 2024-06-18: qty 20

## 2024-06-18 MED ORDER — SODIUM CHLORIDE 0.9% FLUSH
3.0000 mL | INTRAVENOUS | Status: DC | PRN
Start: 2024-06-18 — End: 2024-06-20

## 2024-06-18 MED ORDER — NALOXONE HCL 0.4 MG/ML IJ SOLN: 0.4000 mg | INTRAMUSCULAR | Status: DC | PRN

## 2024-06-18 MED ORDER — OXYTOCIN-SODIUM CHLORIDE 30-0.9 UT/500ML-% IV SOLN: 2.5000 [IU]/h | INTRAVENOUS | Status: AC

## 2024-06-18 MED ORDER — DIPHENHYDRAMINE HCL 50 MG/ML IJ SOLN: 12.5000 mg | INTRAMUSCULAR | Status: DC | PRN

## 2024-06-18 MED ORDER — PROPOFOL 10 MG/ML IV BOLUS
INTRAVENOUS | Status: AC
  Filled 2024-06-18: qty 20

## 2024-06-18 MED ORDER — LIDOCAINE-EPINEPHRINE (PF) 2 %-1:200000 IJ SOLN
INTRAMUSCULAR | Status: DC | PRN
  Administered 2024-06-18: 10 mL via EPIDURAL

## 2024-06-18 MED ORDER — DEXAMETHASONE SODIUM PHOSPHATE 10 MG/ML IJ SOLN
INTRAMUSCULAR | Status: AC
  Filled 2024-06-18: qty 1

## 2024-06-18 MED ORDER — FENTANYL CITRATE (PF) 100 MCG/2ML IJ SOLN
INTRAMUSCULAR | Status: AC
  Filled 2024-06-18: qty 2

## 2024-06-18 MED ORDER — HYDROMORPHONE HCL 1 MG/ML IJ SOLN: 0.2500 mg | INTRAMUSCULAR | Status: DC | PRN

## 2024-06-18 MED ORDER — MORPHINE SULFATE (PF) 0.5 MG/ML IJ SOLN
INTRAMUSCULAR | Status: AC
  Filled 2024-06-18: qty 10

## 2024-06-18 MED ORDER — TRANEXAMIC ACID-NACL 1000-0.7 MG/100ML-% IV SOLN
INTRAVENOUS | Status: DC | PRN
  Administered 2024-06-18: 1000 mg via INTRAVENOUS

## 2024-06-18 MED ORDER — SIMETHICONE 80 MG PO CHEW: 80.0000 mg | CHEWABLE_TABLET | ORAL | Status: DC | PRN

## 2024-06-18 MED ORDER — SIMETHICONE 80 MG PO CHEW
80.0000 mg | CHEWABLE_TABLET | Freq: Three times a day (TID) | ORAL | Status: DC
  Administered 2024-06-18: 80 mg via ORAL
  Filled 2024-06-18 (×5): qty 1

## 2024-06-18 MED ORDER — LIDOCAINE-EPINEPHRINE 2 %-1:100000 IJ SOLN
INTRAMUSCULAR | Status: DC | PRN
Start: 2024-06-18 — End: 2024-06-18

## 2024-06-18 MED ORDER — PRENATAL MULTIVITAMIN CH
1.0000 | ORAL_TABLET | Freq: Every day | ORAL | Status: DC
  Administered 2024-06-18 – 2024-06-20 (×5): 1 via ORAL
  Filled 2024-06-18 (×3): qty 1

## 2024-06-18 MED ORDER — ACETAMINOPHEN 500 MG PO TABS: 1000.0000 mg | ORAL_TABLET | Freq: Four times a day (QID) | ORAL | Status: DC

## 2024-06-18 MED ORDER — DIBUCAINE (PERIANAL) 1 % EX OINT: 1.0000 | TOPICAL_OINTMENT | CUTANEOUS | Status: DC | PRN

## 2024-06-18 MED ORDER — COCONUT OIL OIL: 1.0000 | TOPICAL_OIL | Status: DC | PRN

## 2024-06-18 MED ORDER — CHLOROPROCAINE HCL (PF) 3 % IJ SOLN
INTRAMUSCULAR | Status: AC
  Filled 2024-06-18: qty 20

## 2024-06-18 MED ORDER — BUPIVACAINE HCL (PF) 0.25 % IJ SOLN
INTRAMUSCULAR | Status: AC
  Filled 2024-06-18: qty 30

## 2024-06-18 MED ORDER — OXYCODONE HCL 5 MG PO TABS: 5.0000 mg | ORAL_TABLET | Freq: Once | ORAL | Status: DC | PRN

## 2024-06-18 MED ORDER — MEPERIDINE HCL 25 MG/ML IJ SOLN: 6.2500 mg | INTRAMUSCULAR | Status: DC | PRN

## 2024-06-18 MED ORDER — TRANEXAMIC ACID-NACL 1000-0.7 MG/100ML-% IV SOLN
INTRAVENOUS | Status: AC
Start: 2024-06-18 — End: 2024-06-18
  Filled 2024-06-18: qty 100

## 2024-06-18 MED ORDER — LACTATED RINGERS IV SOLN: INTRAVENOUS | Status: DC

## 2024-06-18 MED ORDER — KETOROLAC TROMETHAMINE 30 MG/ML IJ SOLN
INTRAMUSCULAR | Status: AC
Start: 2024-06-18 — End: 2024-06-18
  Filled 2024-06-18: qty 1

## 2024-06-18 MED ORDER — SODIUM CHLORIDE 0.9 % IV SOLN
INTRAVENOUS | Status: DC | PRN
  Administered 2024-06-18: 500 mg via INTRAVENOUS

## 2024-06-18 MED ORDER — KETOROLAC TROMETHAMINE 30 MG/ML IJ SOLN: 30.0000 mg | Freq: Four times a day (QID) | INTRAMUSCULAR | Status: AC | PRN

## 2024-06-18 MED ORDER — METOCLOPRAMIDE HCL 5 MG/ML IJ SOLN
INTRAMUSCULAR | Status: AC
  Filled 2024-06-18: qty 2

## 2024-06-18 MED ORDER — ACETAMINOPHEN 500 MG PO TABS
1000.0000 mg | ORAL_TABLET | Freq: Four times a day (QID) | ORAL | Status: DC
  Administered 2024-06-18 – 2024-06-20 (×14): 1000 mg via ORAL
  Filled 2024-06-18 (×10): qty 2

## 2024-06-18 MED ORDER — STERILE WATER FOR IRRIGATION IR SOLN
Status: DC | PRN
  Administered 2024-06-18: 1000 mL

## 2024-06-18 MED ORDER — WITCH HAZEL-GLYCERIN EX PADS: 1.0000 | MEDICATED_PAD | CUTANEOUS | Status: DC | PRN

## 2024-06-18 MED ORDER — ONDANSETRON HCL 4 MG/2ML IJ SOLN
4.0000 mg | Freq: Three times a day (TID) | INTRAMUSCULAR | Status: DC | PRN
  Administered 2024-06-18: 4 mg via INTRAVENOUS
  Filled 2024-06-18: qty 2

## 2024-06-18 MED ORDER — PHENYLEPHRINE HCL (PRESSORS) 10 MG/ML IV SOLN
INTRAVENOUS | Status: DC | PRN
  Administered 2024-06-18: 80 ug via INTRAVENOUS
  Administered 2024-06-18 (×2): 160 ug via INTRAVENOUS

## 2024-06-18 MED ORDER — NALOXONE HCL 4 MG/10ML IJ SOLN: 1.0000 ug/kg/h | INTRAVENOUS | Status: DC | PRN

## 2024-06-18 MED ORDER — SCOPOLAMINE 1 MG/3DAYS TD PT72
1.0000 | MEDICATED_PATCH | Freq: Once | TRANSDERMAL | Status: DC
  Administered 2024-06-18: 1.5 mg via TRANSDERMAL
  Filled 2024-06-18: qty 1

## 2024-06-18 MED ORDER — CEFAZOLIN SODIUM-DEXTROSE 2-3 GM-%(50ML) IV SOLR
INTRAVENOUS | Status: DC | PRN
  Administered 2024-06-18: 2 g via INTRAVENOUS

## 2024-06-18 MED ORDER — OXYTOCIN-SODIUM CHLORIDE 30-0.9 UT/500ML-% IV SOLN
INTRAVENOUS | Status: DC | PRN
  Administered 2024-06-18: 300 mL via INTRAVENOUS

## 2024-06-18 MED ORDER — MEPERIDINE HCL 25 MG/ML IJ SOLN
INTRAMUSCULAR | Status: DC | PRN
  Administered 2024-06-18: 12.5 mg via INTRAVENOUS

## 2024-06-18 MED ORDER — ZOLPIDEM TARTRATE 5 MG PO TABS: 5.0000 mg | ORAL_TABLET | Freq: Every evening | ORAL | Status: DC | PRN

## 2024-06-18 MED ORDER — PHENYLEPHRINE 80 MCG/ML (10ML) SYRINGE FOR IV PUSH (FOR BLOOD PRESSURE SUPPORT)
PREFILLED_SYRINGE | INTRAVENOUS | Status: DC | PRN
  Administered 2024-06-18 (×2): 80 ug via INTRAVENOUS

## 2024-06-18 MED ORDER — MORPHINE SULFATE (PF) 0.5 MG/ML IJ SOLN
INTRAMUSCULAR | Status: DC | PRN
  Administered 2024-06-18: 3 mg via EPIDURAL

## 2024-06-18 MED ORDER — AMISULPRIDE (ANTIEMETIC) 5 MG/2ML IV SOLN: 10.0000 mg | Freq: Once | INTRAVENOUS | Status: DC | PRN

## 2024-06-18 MED ORDER — OXYCODONE HCL 5 MG PO TABS: 5.0000 mg | ORAL_TABLET | ORAL | Status: DC | PRN

## 2024-06-18 MED ORDER — DIPHENHYDRAMINE HCL 25 MG PO CAPS: 25.0000 mg | ORAL_CAPSULE | ORAL | Status: DC | PRN

## 2024-06-18 MED ORDER — MEPERIDINE HCL 25 MG/ML IJ SOLN
INTRAMUSCULAR | Status: AC
Start: 2024-06-18 — End: 2024-06-18
  Filled 2024-06-18: qty 1

## 2024-06-18 MED ORDER — DEXAMETHASONE SODIUM PHOSPHATE 10 MG/ML IJ SOLN
INTRAMUSCULAR | Status: DC | PRN
  Administered 2024-06-18 (×2): 5 mg via INTRAVENOUS

## 2024-06-18 MED ORDER — IBUPROFEN 600 MG PO TABS
600.0000 mg | ORAL_TABLET | Freq: Four times a day (QID) | ORAL | Status: DC
  Administered 2024-06-18 – 2024-06-20 (×14): 600 mg via ORAL
  Filled 2024-06-18 (×9): qty 1

## 2024-06-18 MED ORDER — FENTANYL CITRATE (PF) 100 MCG/2ML IJ SOLN
INTRAMUSCULAR | Status: DC | PRN
  Administered 2024-06-18: 100 ug via INTRAVENOUS

## 2024-06-18 MED ORDER — MENTHOL 3 MG MT LOZG: 1.0000 | LOZENGE | OROMUCOSAL | Status: DC | PRN

## 2024-06-18 MED ORDER — ONDANSETRON HCL 4 MG/2ML IJ SOLN: 4.0000 mg | Freq: Once | INTRAMUSCULAR | Status: DC | PRN

## 2024-06-18 MED ORDER — CHLOROPROCAINE HCL (PF) 3 % IJ SOLN
INTRAMUSCULAR | Status: DC | PRN
Start: 2024-06-18 — End: 2024-06-18
  Administered 2024-06-18: 10 mL

## 2024-06-18 MED ORDER — ACETAMINOPHEN 10 MG/ML IV SOLN
INTRAVENOUS | Status: DC | PRN
  Administered 2024-06-18: 1000 mg via INTRAVENOUS

## 2024-06-18 MED ORDER — ONDANSETRON HCL 4 MG/2ML IJ SOLN
INTRAMUSCULAR | Status: DC | PRN
  Administered 2024-06-18: 4 mg via INTRAVENOUS

## 2024-06-18 MED ORDER — TETANUS-DIPHTH-ACELL PERTUSSIS 5-2.5-18.5 LF-MCG/0.5 IM SUSY: 0.5000 mL | PREFILLED_SYRINGE | Freq: Once | INTRAMUSCULAR | Status: DC

## 2024-06-18 MED ORDER — OXYCODONE HCL 5 MG/5ML PO SOLN: 5.0000 mg | Freq: Once | ORAL | Status: DC | PRN

## 2024-06-18 MED ORDER — EPHEDRINE 5 MG/ML INJ
INTRAVENOUS | Status: AC
  Filled 2024-06-18: qty 10

## 2024-06-18 MED ORDER — LIDOCAINE-EPINEPHRINE (PF) 2 %-1:200000 IJ SOLN
INTRAMUSCULAR | Status: AC
  Filled 2024-06-18: qty 40

## 2024-06-18 MED ORDER — DIPHENHYDRAMINE HCL 25 MG PO CAPS: 25.0000 mg | ORAL_CAPSULE | Freq: Four times a day (QID) | ORAL | Status: DC | PRN

## 2024-06-18 MED ORDER — SENNOSIDES-DOCUSATE SODIUM 8.6-50 MG PO TABS
2.0000 | ORAL_TABLET | ORAL | Status: DC
  Administered 2024-06-19 (×2): 2 via ORAL
  Filled 2024-06-18 (×3): qty 2

## 2024-06-18 MED ORDER — OXYTOCIN-SODIUM CHLORIDE 30-0.9 UT/500ML-% IV SOLN
INTRAVENOUS | Status: AC
  Filled 2024-06-18: qty 1500

## 2024-06-18 SURGICAL SUPPLY — 35 items
BARRIER ADHS 3X4 INTERCEED (GAUZE/BANDAGES/DRESSINGS) ×1 IMPLANT
BENZOIN TINCTURE PRP APPL 2/3 (GAUZE/BANDAGES/DRESSINGS) IMPLANT
CHLORAPREP W/TINT 26 (MISCELLANEOUS) ×1 IMPLANT
CLAMP UMBILICAL CORD (MISCELLANEOUS) IMPLANT
CLOTH BEACON ORANGE TIMEOUT ST (SAFETY) ×1 IMPLANT
DERMABOND ADVANCED .7 DNX12 (GAUZE/BANDAGES/DRESSINGS) IMPLANT
DRAPE C SECTION CLR SCREEN (DRAPES) ×1 IMPLANT
DRSG OPSITE POSTOP 4X10 (GAUZE/BANDAGES/DRESSINGS) ×1 IMPLANT
ELECTRODE REM PT RTRN 9FT ADLT (ELECTROSURGICAL) ×1 IMPLANT
EXTRACTOR VACUUM M CUP 4 TUBE (SUCTIONS) IMPLANT
GLOVE BIOGEL PI IND STRL 7.0 (GLOVE) ×2 IMPLANT
GLOVE ECLIPSE 6.5 STRL STRAW (GLOVE) ×1 IMPLANT
GOWN STRL REUS W/TWL LRG LVL3 (GOWN DISPOSABLE) ×2 IMPLANT
KIT ABG SYR 3ML LUER SLIP (SYRINGE) IMPLANT
MAT PREVALON FULL STRYKER (MISCELLANEOUS) IMPLANT
NDL HYPO 25X5/8 SAFETYGLIDE (NEEDLE) IMPLANT
NEEDLE HYPO 22GX1.5 SAFETY (NEEDLE) ×1 IMPLANT
NEEDLE HYPO 25X5/8 SAFETYGLIDE (NEEDLE) IMPLANT
NS IRRIG 1000ML POUR BTL (IV SOLUTION) ×1 IMPLANT
PACK C SECTION WH (CUSTOM PROCEDURE TRAY) ×1 IMPLANT
PAD OB MATERNITY 4.3X12.25 (PERSONAL CARE ITEMS) ×1 IMPLANT
RTRCTR C-SECT PINK 25CM LRG (MISCELLANEOUS) IMPLANT
STRIP CLOSURE SKIN 1/2X4 (GAUZE/BANDAGES/DRESSINGS) IMPLANT
SUT CHROMIC GUT AB #0 18 (SUTURE) IMPLANT
SUT MNCRL 0 VIOLET CTX 36 (SUTURE) ×3 IMPLANT
SUT MON AB 4-0 PS1 27 (SUTURE) ×1 IMPLANT
SUT PLAIN 2 0 XLH (SUTURE) IMPLANT
SUT PLAIN ABS 2-0 CT1 27XMFL (SUTURE) IMPLANT
SUT VIC AB 0 CT1 27XBRD ANBCTR (SUTURE) IMPLANT
SUT VIC AB 0 CT1 36 (SUTURE) ×2 IMPLANT
SUT VIC AB 2-0 CT1 TAPERPNT 27 (SUTURE) ×1 IMPLANT
SYR CONTROL 10ML LL (SYRINGE) ×1 IMPLANT
TOWEL OR 17X24 6PK STRL BLUE (TOWEL DISPOSABLE) ×1 IMPLANT
TRAY FOLEY W/BAG SLVR 14FR LF (SET/KITS/TRAYS/PACK) IMPLANT
WATER STERILE IRR 1000ML POUR (IV SOLUTION) ×1 IMPLANT

## 2024-06-18 NOTE — Op Note (Signed)
 Christine Morgan, Christine Morgan MEDICAL RECORD NO: [id number] ACCOUNT NO: [id number] DATE OF BIRTH: 01/12/1981 FACILITY: [facility] LOCATION: [facility] PHYSICIAN: Latonya Nelon A. Rutherford, MD  Operative Report   DATE OF PROCEDURE: 06/18/2024  PREOPERATIVE DIAGNOSES: 1.  Fetal intolerance to labor. 2.  Arrest of dilatation. 3.  Advanced maternal age, term gestation.  PROCEDURE:  Emergency primary cesarean section, Kerr hysterotomy.  POSTOPERATIVE DIAGNOSES: 1.  Fetal bradycardia. 2.  Arrest of dilatation. 3.  Advanced maternal age, term gestation.  ANESTHESIA:  Epidural.  SURGEON:  Dickie Rutherford, MD.  ASSISTANT:  None.  INDICATION:  The patient was transferred to the operating room for fetal intolerance to labor.  On arrival into the operating room, fetal heart rate was noted to be in the 90s, 96 to be exact, and the procedure became an emergent cesarean section.  PROCEDURE:  Under adequate epidural anesthesia, the patient was quickly prepped and draped in the usual fashion.  An indwelling Foley catheter was already in place.  Pfannenstiel skin incision was then made and carried down to the rectus fascia.  The  rectus fascia was opened transversely.  The inferior rectus fascia was taken off the rectus muscle.  The rectus muscle was split in the midline.  The parietal peritoneum was entered sharply and extended bluntly.  A bladder retractor was then placed.   Incision was made on the lower uterine segment of the uterus and carried down in the midline, subsequently opened in a cephalic and caudal manner using blunt dissection.  Subsequent delivery of a live female from a right occiput transverse position with  nuchal cord x1, which was reduced.  The baby was bulb suctioned in the abdomen.  The cord was quickly clamped, cut, and the baby was transferred to the awaiting pediatricians whose Apgar scores are pending at the time of this dictation.  The cord had  been opened to collect blood prior to being  able to get the cord pH that had been requested.  Subsequent cord pH was obtained after re-clamping the cord.  Spontaneous delivery of the placenta was done.  Placenta was sent to pathology.  The uterine cavity  was cleaned of debris.  The uterine incision had no extension.  The uterine incision was closed in two layers.  The first layer of 0 Monocryl running locked stitch.  Second layer was imbricated using 0 Monocryl suture.  Abdomen was irrigated and  suctioned of debris.  Normal tubes and ovaries were noted bilaterally.  The intra-abdominal cavity was felt to be warm.  Repeat temperature was done on the patient, which still was 98.6.  The Alexis retractor was placed prior to the delivery of the  placenta.  It was subsequently removed.  The parietal peritoneum was then closed with 2-0 Vicryl.  The rectus fascia was closed with 0 Vicryl x2.  The subcutaneous area was irrigated.  A small bleeder was cauterized.  Interrupted 2-0 plain suture was  placed in the skin approximating 4-0 Vicryl subcuticular closure.  Steri-Strips and benzoin were placed.  Honeycomb dressing was placed.  SPECIMEN:  Placenta sent to pathology.  ESTIMATED BLOOD LOSS:  250.  INTRAOPERATIVE FLUIDS:  1500.  URINE OUTPUT:  Up to 100 mL.  COUNTS:  Sponge and instrument counts x2 were correct.  COMPLICATIONS: None.  DISPOSITION:  The patient tolerated the procedure well and was transferred to the recovery room in stable condition.   PUS D: 06/18/2024 8:30:43 am T: 06/18/2024 12:50:00 pm  JOB: [id number]/ [id number]

## 2024-06-18 NOTE — Transfer of Care (Signed)
 Immediate Anesthesia Transfer of Care Note  Patient: [facility]  Procedure(s) Performed: CESAREAN DELIVERY  Patient Location: PACU  Anesthesia Type:Epidural  Level of Consciousness: awake, alert , and oriented  Airway & Oxygen Therapy: Patient Spontanous Breathing  Post-op Assessment: Report given to RN and Post -op Vital signs reviewed and stable  Post vital signs: Reviewed and stable  Last Vitals:  Vitals Value Taken Time  BP 113/70 06/18/24 09:00  Temp    Pulse 97 06/18/24 09:03  Resp 18 06/18/24 09:03  SpO2 99 % 06/18/24 09:03  Vitals shown include unfiled device data.  Last Pain:  Vitals:   06/18/24 0845  TempSrc: Axillary  PainSc:          Complications: No notable events documented.

## 2024-06-18 NOTE — Progress Notes (Signed)
 Await call from OR to bring pt back  Off pitocin    Tracing> baseline 145 to 150  with accels to 160-165 (+) variable decel x 1. For 20 seconds IUPC out.   Now en route to OR

## 2024-06-18 NOTE — Progress Notes (Signed)
 Called by RN regarding fetal heart rate elevation to 170's-180 Reports pt feeling a lot of pressure.  On arrival to room. per RN notes  cervix unchanged T98.8 O: BP 108/72   Pulse 69   Temp 98.8 F (37.1 C) (Axillary)   Resp 17   Ht 5' 4.5 (1.638 m)   Wt 89 kg   SpO2 97%   BMI 33.14 kg/m   Tracing reviewed: baseline 175-180 Variable decels and late deceleration Ctx  q  IMP fetal intolerance to labor Non reassuring fetal tracing.  Arrest of dilation Pt who is tearful advised of the need for C/S. Procedure explained. Risk reviewed including infection, bleeding, injury to bladder, bowel, ureter. Possible need for blood transfusion and its risk.  Consent signed. OR notified

## 2024-06-18 NOTE — Anesthesia Postprocedure Evaluation (Signed)
 Anesthesia Post Note  Patient: [facility]  Procedure(s) Performed: CESAREAN DELIVERY     Patient location during evaluation: PACU Anesthesia Type: Epidural Level of consciousness: awake and alert and oriented Pain management: pain level controlled Vital Signs Assessment: post-procedure vital signs reviewed and stable Respiratory status: spontaneous breathing, nonlabored ventilation and respiratory function stable Cardiovascular status: blood pressure returned to baseline and stable Postop Assessment: no headache, no backache, epidural receding and no apparent nausea or vomiting Anesthetic complications: no   No notable events documented.  Last Vitals:  Vitals:   06/18/24 0915 06/18/24 0922  BP: (!) 99/59 97/86  Pulse:  76  Resp:    Temp:    SpO2:  100%    Last Pain:  Vitals:   06/18/24 0910  TempSrc:   PainSc: 0-No pain                 Almarie CHRISTELLA Marchi

## 2024-06-18 NOTE — Brief Op Note (Signed)
 06/18/2024  9:02 AM  PATIENT:  Christine Morgan  43 y.o. female  PRE-OPERATIVE DIAGNOSIS:  Fetal intolerance to labor; upgraded to urgent case due to fetal heart rate  POST-OPERATIVE DIAGNOSIS:  Fetal intolerance to labor; upgraded to urgent case due to fetal heart rate  PROCEDURE:  Procedure(s): CESAREAN DELIVERY (N/A)  SURGEON:  Surgeons and Role:    * Rutherford Gain, MD - Primary  PHYSICIAN ASSISTANT:   ASSISTANTS: {ASSISTANTS:31801}   ANESTHESIA:   {procedures; anesthesia:812}  EBL:  {None/Minimal: 21241}   BLOOD ADMINISTERED:{BLOOD GIVEN TYPES AND AMOUNTS:20467}  DRAINS: {Devices; drains:31758}   LOCAL MEDICATIONS USED:  {LOCAL MEDICATIONS:10721995}  SPECIMEN:  {ONC STAGING; AJCC TYPE OF SPECIMEN:115600001}  DISPOSITION OF SPECIMEN:  {SPECIMEN DISPOSITION:204680}  COUNTS:  {OR COUNTS CORRECT/INCORRECT:204690}  TOURNIQUET:  * No tourniquets in log *  DICTATION: .{DICTATION TYPES:404 806 8851}  PLAN OF CARE: {OPTIME PLAN OF RJMZ:8924449987}  PATIENT DISPOSITION:  {op note disposition:31782}   Delay start of Pharmacological VTE agent (>24hrs) due to surgical blood loss or risk of bleeding: {YES/NO/NOT APPLICABLE:20182}

## 2024-06-18 NOTE — Lactation Note (Signed)
 This note was copied from a baby's chart. Lactation Consultation Note  Patient Name: Girl Tiandra Swoveland Unijb'd Date: 06/18/2024 Age:41 hours Reason for consult: Primapara;1st time breastfeeding;Initial assessment;Term  P1, 40 wks, @ 5 hrs old. Baby breast fed on right breast before LC arrival, baby still showing feeding cues- mom receptive to bringing baby to left breast. Demonstrated starting with hand expression, steps of latching. Infant responds well, infant feeds for 15 minutes. Offered suggestions to mom- receptive to learning.  Discussed expectations @ breast- Day 1- sleepy/ feed every 3 hrs/ even 10 minutes is okay, Day 2 more awake/ feeding cues/longer feeds, and cluster feeding overnights brings milk in. Highlighted breast stimulation is tied directly to milk production. Discussed hands on breast and baby, keeping baby awake @ breast. Starting with hand expression & breast compression to get baby working @ breast, and gentle stimulation to keep baby working @ breast. Encouraged EBM for nipple care post feed. LC services and milk storage shared. Encouraged mom to call for assist anytime desired. Hand pump provided.  Maternal Data Has patient been taught Hand Expression?: Yes Does the patient have breastfeeding experience prior to this delivery?: No  Feeding Mother's Current Feeding Choice: Breast Milk  LATCH Score Latch: Grasps breast easily, tongue down, lips flanged, rhythmical sucking.  Audible Swallowing: Spontaneous and intermittent  Type of Nipple: Everted at rest and after stimulation  Comfort (Breast/Nipple): Soft / non-tender  Hold (Positioning): Assistance needed to correctly position infant at breast and maintain latch. (Mom takes over)  [facility] Score: 9   Lactation Tools Discussed/Used Tools: Pump;Flanges Flange Size: 21 Breast pump type: Manual Pump Education: Milk Storage;Setup, frequency, and cleaning  Interventions Interventions: Breast feeding basics  reviewed;Assisted with latch;Hand express;Breast compression;Hand pump;Education;LC Services brochure;CDC milk storage guidelines  Discharge Pump: DEBP;Manual;Personal  Consult Status Consult Status: Follow-up Date: 06/19/24 Follow-up type: In-patient    Rodolphe Edmonston 06/18/2024, 1:01 PM

## 2024-06-18 NOTE — Progress Notes (Signed)
 Christine Morgan is a 44 y.o. G2P0010 at [redacted]w[redacted]d by LMP admitted for induction of labor due to [facility].  Subjective: C/o intermittent rectal pressure  Objective: Pitocin  10 miu BP (!) 101/59   Pulse 68   Temp 98 F (36.7 C) (Oral)   Resp 17   Ht 5' 4.5 (1.638 m)   Wt 89 kg   SpO2 97%   BMI 33.14 kg/m  No intake/output data recorded. No intake/output data recorded.  FHT:  FHR: 169 bpm, variability: minimal ,  accelerations:  Present,  decelerations:  Absent UC:   irregular, every 2-3 minutes MVU 150  SVE:   5 cm dilated, 80% effaced, -2  station Deviated to left  Labs: Lab Results  Component Value Date   WBC 9.3 06/17/2024   HGB 12.9 06/17/2024   HCT 38.3 06/17/2024   MCV 92.5 06/17/2024   PLT 316 06/17/2024    Assessment / Plan: Arrest of dilation  however suboptimal contractions  Term gestation Disc will need C/S if heart rate elevates again or no change in cervix  P) continue with pitocin   Anticipated MOD:  guarded ( possible) C/S  Christine Morgan A Renel Ende 06/18/2024, 4:15 AM

## 2024-06-18 NOTE — Progress Notes (Signed)
 S/P Epidural Pitocin  off   Contractions suboptimal and spaced. Post Epidural, tracing baseline decreased to 160's  Subsequent ( accels noted)   O: BP 104/60   Pulse 73   Temp 98.4 F (36.9 C) (Axillary)   Resp 16   Ht 5' 4.5 (1.638 m)   Wt 89 kg   SpO2 97%   BMI 33.14 kg/m  VE deferred   IMP: fetal tachycardia resolved. Etiology remains unchanged AMA Term gestation Disc with pt and family, regarding plan of care,  Restart pitocin . If unable to tolerate labor, proceed with C/S P) restart @ 2 miu Pitocin . Close fetal monitoring

## 2024-06-19 LAB — CBC
HCT: 29.5 % — ABNORMAL LOW (ref 36.0–46.0)
Hemoglobin: 10 g/dL — ABNORMAL LOW (ref 12.0–15.0)
MCH: 31.3 pg (ref 26.0–34.0)
MCHC: 33.9 g/dL (ref 30.0–36.0)
MCV: 92.2 fL (ref 80.0–100.0)
Platelets: 237 K/uL (ref 150–400)
RBC: 3.2 MIL/uL — ABNORMAL LOW (ref 3.87–5.11)
RDW: 15 % (ref 11.5–15.5)
WBC: 20.6 K/uL — ABNORMAL HIGH (ref 4.0–10.5)
nRBC: 0 % (ref 0.0–0.2)

## 2024-06-19 NOTE — Lactation Note (Signed)
 This note was copied from a baby's chart. Lactation Consultation Note  Patient Name: Christine Morgan Unijb'd Date: 06/19/2024 Age:41 hours Reason for consult: Follow-up assessment;Mother's request;1st time breastfeeding;Primapara  P1, 40 35 hrs. Discussed moms DEBP, and hand pump use. Hand expression tried with mom, no colostrum seen @ this moment. Cross-cradle to left breast used. Infant responds well, changes in holds/positioning made with mom. Infant works well if stimulated. Couplet continues to feed with LC exit.  Maternal Data Has patient been taught Hand Expression?: Yes Does the patient have breastfeeding experience prior to this delivery?: No  Feeding Mother's Current Feeding Choice: Breast Milk  LATCH Score Latch: Grasps breast easily, tongue down, lips flanged, rhythmical sucking.  Audible Swallowing: Spontaneous and intermittent  Type of Nipple: Everted at rest and after stimulation  Comfort (Breast/Nipple): Soft / non-tender  Hold (Positioning): Assistance needed to correctly position infant at breast and maintain latch. (Mom takes over)  [facility] Score: 9   Lactation Tools Discussed/Used Flange Size: 18 Breast pump type: Manual Pump Education: Milk Storage;Setup, frequency, and cleaning  Interventions Interventions: Breast feeding basics reviewed;Hand express;Breast compression;Hand pump;Education  Discharge Pump: Manual;DEBP  Consult Status Consult Status: Follow-up Date: 06/20/24 Follow-up type: In-patient    [name] 06/19/2024, 7:13 PM

## 2024-06-19 NOTE — Progress Notes (Signed)
 Patient states that she voided but she emptied it out with out calling for the nurse

## 2024-06-19 NOTE — Progress Notes (Signed)
 PPD1 C/S:   S:  Pt reports feeling well. Request IV out/ Tolerating po/ Voiding without problems/ No n/v/ Bleeding is light/ Pain controlled withacetaminophen and prescription NSAID's including ibuprofen  (Motrin )  Newborn info , live female BRF   O:  A & O x 3 / VS: Blood pressure (!) 104/56, pulse 66, temperature 98 F (36.7 C), temperature source Oral, resp. rate 16, height 5' 4.5 (1.638 m), weight 89 kg, SpO2 100%, unknown if currently breastfeeding.  LABS:  Results for orders placed or performed during the hospital encounter of 06/17/24 (from the past 24 hours)  CBC     Status: Abnormal   Collection Time: 06/19/24  4:20 AM  Result Value Ref Range   WBC 20.6 (H) 4.0 - 10.5 K/uL   RBC 3.20 (L) 3.87 - 5.11 MIL/uL   Hemoglobin 10.0 (L) 12.0 - 15.0 g/dL   HCT 70.4 (L) 63.9 - 53.9 %   MCV 92.2 80.0 - 100.0 fL   MCH 31.3 26.0 - 34.0 pg   MCHC 33.9 30.0 - 36.0 g/dL   RDW 84.9 88.4 - 84.4 %   Platelets 237 150 - 400 K/uL   nRBC 0.0 0.0 - 0.2 %    I&O: I/O last 3 completed shifts: In: 1375.4 [I.V.:1125.4; IV Piggyback:250] Out: 3587 [Urine:3250; Blood:337]   Total I/O In: -  Out: 800 [Urine:800]  Lungs: chest clear, no wheezing, rales, normal symmetric air entry  Heart: regular rate and rhythm  Abdomen: uterus firm at umb surgical tender. Primary dressing: honeycomb. Small  stain on right  Perineum: is normal  Lochia: light  Extremities:no redness or tenderness in the calves or thighs, no edema    A/P: PPD # 1/ G2P1011 sp C/S  Doing well  Continue routine post partum orders

## 2024-06-20 ENCOUNTER — Encounter (HOSPITAL_COMMUNITY): Payer: Self-pay | Admitting: Obstetrics and Gynecology

## 2024-06-20 LAB — SURGICAL PATHOLOGY

## 2024-06-20 MED ORDER — IBUPROFEN 600 MG PO TABS: 600.0000 mg | ORAL_TABLET | Freq: Four times a day (QID) | ORAL | 11 refills | Status: AC | PRN

## 2024-06-20 MED ORDER — OXYCODONE HCL 5 MG PO TABS: 5.0000 mg | ORAL_TABLET | ORAL | 0 refills | Status: AC | PRN

## 2024-06-20 NOTE — Progress Notes (Signed)
 SVD: primary  S:  Pt reports feeling well/ Tolerating po/ Voiding without problems/ No n/v/ Bleeding is light/ Pain controlled withprescription NSAID's including ibuprofen  (Motrin ) and narcotic analgesics including oxycodone /ASA (Percodan)    O:  A & O x 3 / VS: Blood pressure 108/69, pulse (!) 56, temperature 97.7 F (36.5 C), temperature source Oral, resp. rate 16, height 5' 4.5 (1.638 m), weight 89 kg, SpO2 98%, unknown if currently breastfeeding.  LABS: No results found for this or any previous visit (from the past 24 hours).  I&O: I/O last 3 completed shifts: In: 125.4 [I.V.:125.4] Out: 1150 [Urine:1150]   No intake/output data recorded.  Lungs: chest clear, no wheezing, rales, normal symmetric air entry  Heart: regular rate and rhythm  Abdomen: soft uterus firm at umb  primary dressing intact/dry  Perineum: not inspected  Lochia: light  Extremities:no redness or tenderness in the calves or thighs, no edema    A/P: POD # 3/PPD # 3/ [id number]  Doing well  Continue routine post partum orders  D/c home  D/c instructions reviewed  F/u 6 wk

## 2024-06-20 NOTE — Lactation Note (Signed)
 This note was copied from a baby's chart. Lactation Consultation Note  Patient Name: Christine Morgan Date: 06/20/2024 Age:41 hours, P1  Reason for consult: Follow-up assessment;Primapara;1st time breastfeeding;Infant weight loss;Breastfeeding assistance LC offered to assess a latch, and mom receptive. Large wet diaper was changed. Mom 1st worked on latching on the left breast cross cradle and LC noted she would latch and then pulled away from the breast. LC recommended burping the baby 1st, and the baby burped and then latched with depth. ( See below under latch the details.) of LC plan.  LC reviewed breast feeding D/C teaching including engorgement prevention and tx, feed with cues and by 3 hours STS, Use the football or cross cradle until the baby is latching consistently well.  LC offered to request and LC O/P and mom receptive.    Maternal Data - PCOS.  Has patient been taught Hand Expression?: Yes (per mom breast are fuller today)  Feeding Mother's Current Feeding Choice: Breast Milk  LATCH Score Latch: Grasps breast easily, tongue down, lips flanged, rhythmical sucking. (baby latched with depth and observed pulling back from mom. LC suspects the baby has gotten in the habit of doing that and the solution is to enhance the flow so the baby stays in a consistent pattern , baby did better on the right breast than the left .)  Audible Swallowing: A few with stimulation  Type of Nipple: Everted at rest and after stimulation  Comfort (Breast/Nipple): Soft / non-tender  Hold (Positioning): Assistance needed to correctly position infant at breast and maintain latch.  LATCH Score: 8   Lactation Tools Discussed/Used Tools: Pump Flange Size: 18;21 Breast pump type: Manual Pump Education: Setup, frequency, and cleaning;Milk Storage Reason for Pumping: LC recommended prior to latching prepumping to prime the milk ducts and post pump both breast after 4 feedings a day to  enhance the flow  Interventions Interventions: Breast feeding basics reviewed;Assisted with latch;Skin to skin;Breast massage;Hand express;Pre-pump if needed;Reverse pressure;Breast compression;Adjust position;Support pillows;Position options;Hand pump;Education;LC Services brochure;CDC milk storage guidelines;CDC Guidelines for Breast Pump Cleaning  Discharge Discharge Education: Engorgement and breast care;Warning signs for feeding baby;Outpatient recommendation;Outpatient Epic message sent;Other (comment) (LC offered to request and recommended and mom receptive.) Pump: DEBP;Manual;Personal;Hands Free  Consult Status Consult Status: Complete Date: 06/20/24    Christine Morgan 06/20/2024, 12:22 PM

## 2024-06-20 NOTE — Discharge Instructions (Signed)
 Call if temperature greater than equal to 100.4, nothing per vagina for 4-6 weeks or severe nausea vomiting, increased incisional pain , drainage or redness in the incision site, no straining with bowel movements, showers no bath

## 2024-06-20 NOTE — Discharge Summary (Signed)
 Postpartum Discharge Summary  Date of Service updated     Patient Name: Christine Morgan DOB: [birth year] MRN: [id number]  Date of admission: 06/17/2024 Delivery date:06/18/2024 Delivering provider: Ellamarie Naeve Date of discharge: 06/20/2024  Admitting diagnosis: AMA, term gestation Intrauterine pregnancy: [redacted]w[redacted]d     Secondary diagnosis:  none Additional problems: n/a    Discharge diagnosis: Term Pregnancy Delivered and AMA,                                                Post partum procedures:n/a Augmentation: AROM Complications: None  Hospital course: Induction of Labor With Cesarean Section   43 y.o. yo G2P1011 at [redacted]w[redacted]d was admitted to the hospital 06/17/2024 for induction of labor. Patient had a labor course significant for  IOL with intracervical balloon and IV pitocin .  AROM when unexplaneed fetal tachycardia encountered.  Epidural.  Arrested dilation and recurrence of fetal tachycardia. The patient went for cesarean section due to fetal intolerance to labor, arrest of dilation . Delivery details are as follows: Membrane Rupture Time/Date: 10:05 PM,06/17/2024  Delivery Method:C-Section, Low Transverse Operative Delivery:N/A Details of operation can be found in separate operative Note.  Patient had a postpartum course complicated by none. She is ambulating, tolerating a regular diet, passing flatus, and urinating well.  Patient is discharged home in stable condition on 06/20/2024   Newborn Data: Birth date:06/18/2024 Birth time:7:12 AM Gender:Female Living status:Living Apgars:8 ,9  Weight:3.26 kg                               Magnesium Sulfate received: No BMZ received: No Rhophylac:No MMR:No T-DaP:Given prenatally Flu: No RSV Vaccine received: No Transfusion:No Immunizations administered: Immunization History  Administered Date(s) Administered   Moderna Covid-19 Vaccine Bivalent Booster 21yrs & up 07/31/2021   Moderna Sars-Covid-2 Vaccination 10/29/2020    PFIZER(Purple Top)SARS-COV-2 Vaccination 02/09/2020, 03/02/2020   Tdap 08/30/2015, 12/15/2022    Physical exam  Vitals:   06/19/24 0500 06/19/24 1417 06/19/24 2059 06/20/24 0500  BP: 94/60 (!) 104/56 (!) 99/54 108/69  Pulse: 60 66 64 (!) 56  Resp: 18 16 17 16   Temp: 98.1 F (36.7 C) 98 F (36.7 C) 98.8 F (37.1 C) 97.7 F (36.5 C)  TempSrc: Oral Oral Oral Oral  SpO2: 100% 100% 98% 98%  Weight:      Height:       General: alert, cooperative, and no distress Lochia: appropriate Uterine Fundus: firm Incision: Dressing is clean, dry, and intact DVT Evaluation: No evidence of DVT seen on physical exam. Calf/Ankle edema is present Labs: Lab Results  Component Value Date   WBC 20.6 (H) 06/19/2024   HGB 10.0 (L) 06/19/2024   HCT 29.5 (L) 06/19/2024   MCV 92.2 06/19/2024   PLT 237 06/19/2024      Latest Ref Rng & Units 10/20/2023    8:30 AM  CMP  Glucose 70 - 99 mg/dL 87   BUN 6 - 23 mg/dL 8   Creatinine 9.59 - 8.79 mg/dL 9.11   Sodium 864 - 854 mEq/L 136   Potassium 3.5 - 5.1 mEq/L 3.9   Chloride 96 - 112 mEq/L 105   CO2 19 - 32 mEq/L 25   Calcium 8.4 - 10.5 mg/dL 8.9    Edinburgh Score:    06/19/2024  9:17 AM  Edinburgh Postnatal Depression Scale Screening Tool  I have been able to laugh and see the funny side of things. 0  I have looked forward with enjoyment to things. 0  I have blamed myself unnecessarily when things went wrong. 2  I have been anxious or worried for no good reason. 1  I have felt scared or panicky for no good reason. 0  Things have been getting on top of me. 1  I have been so unhappy that I have had difficulty sleeping. 0  I have felt sad or miserable. 0  I have been so unhappy that I have been crying. 1  The thought of harming myself has occurred to me. 0  Edinburgh Postnatal Depression Scale Total 5      After visit meds:  Allergies as of 06/20/2024       Reactions   Penicillins Rash        Medication List     STOP taking  these medications    aspirin 81 MG chewable tablet       TAKE these medications    albuterol  108 (90 Base) MCG/ACT inhaler Commonly known as: Ventolin  HFA Inhale 2 puffs into the lungs every 6 (six) hours as needed for wheezing or shortness of breath.   ibuprofen  600 MG tablet Commonly known as: ADVIL  Take 1 tablet (600 mg total) by mouth every 6 (six) hours as needed.   oxyCODONE  5 MG immediate release tablet Commonly known as: Oxy IR/ROXICODONE  Take 1 tablet (5 mg total) by mouth every 4 (four) hours as needed for up to 7 days for moderate pain (pain score 4-6) or severe pain (pain score 7-10).   pantoprazole 20 MG tablet Commonly known as: PROTONIX Take 20 mg by mouth daily.   prenatal multivitamin Tabs tablet Take 1 tablet by mouth daily at 12 noon.         Discharge home in stable condition Infant Feeding: Breast Infant Disposition:home with mother Discharge instruction: per After Visit Summary and Postpartum booklet. Activity: Advance as tolerated. Pelvic rest for 6 weeks.  Diet: routine diet Anticipated Birth Control: IUD Postpartum Appointment:6 weeks Additional Postpartum F/U: n/a Future Appointments:No future appointments. Follow up Visit:  Follow-up Information     Rutherford Gain, MD Follow up in 6 week(s).   Specialty: Obstetrics and Gynecology Contact information: [address] [place] [address] [place] [address] [phone]                     06/20/2024 Gain DELENA Rutherford, MD

## 2024-07-01 ENCOUNTER — Telehealth (HOSPITAL_COMMUNITY): Payer: Self-pay

## 2024-07-01 NOTE — Telephone Encounter (Signed)
 07/01/2024 1300  Name: Christine Morgan MRN: [id number] DOB: [birth year]  Reason for Call:  Transition of Care Hospital Discharge Call  Contact Status: Patient Contact Status: Complete  Language assistant needed:          Follow-Up Questions: Do You Have Any Concerns About Your Health As You Heal From Delivery?: Yes What Concerns Do You Have About Your Health?: Patient states she took her honeycomb dressing off her incision and asks about when the steri strips should be removed. RN told patient to allow steri strips to fall off on there own. RN reviewed incision care and signs of infection to report to the provider. Patient has no other concerns or questions at this time. Do You Have Any Concerns About Your Infants Health?: No  Edinburgh Postnatal Depression Scale:  In the Past 7 Days: I have been able to laugh and see the funny side of things.: Not quite so much now I have looked forward with enjoyment to things.: Rather less than I used to I have blamed myself unnecessarily when things went wrong.: No, never I have been anxious or worried for no good reason.: No, not at all I have felt scared or panicky for no good reason.: No, not at all Things have been getting on top of me.: No, most of the time I have coped quite well I have been so unhappy that I have had difficulty sleeping.: Not at all I have felt sad or miserable.: Not very often I have been so unhappy that I have been crying.: No, never The thought of harming myself has occurred to me.: Never Edinburgh Postnatal Depression Scale Total: 4  PHQ2-9 Depression Scale:     Discharge Follow-up: Edinburgh score requires follow up?: No Patient was advised of the following resources:: Breastfeeding Support Group, Support Group  Post-discharge interventions: Reviewed Newborn Safe Sleep Practices  Signature  Rosaline Deretha PEAK

## 2024-07-06 DIAGNOSIS — F419 Anxiety disorder, unspecified: Secondary | ICD-10-CM | POA: Diagnosis not present

## 2024-07-13 DIAGNOSIS — F419 Anxiety disorder, unspecified: Secondary | ICD-10-CM | POA: Diagnosis not present

## 2024-07-19 DIAGNOSIS — F419 Anxiety disorder, unspecified: Secondary | ICD-10-CM | POA: Diagnosis not present

## 2024-07-22 DIAGNOSIS — F419 Anxiety disorder, unspecified: Secondary | ICD-10-CM | POA: Diagnosis not present

## 2024-07-26 DIAGNOSIS — F419 Anxiety disorder, unspecified: Secondary | ICD-10-CM | POA: Diagnosis not present

## 2024-07-27 DIAGNOSIS — F419 Anxiety disorder, unspecified: Secondary | ICD-10-CM | POA: Diagnosis not present

## 2024-08-02 DIAGNOSIS — Z30430 Encounter for insertion of intrauterine contraceptive device: Secondary | ICD-10-CM | POA: Diagnosis not present

## 2024-08-16 DIAGNOSIS — F419 Anxiety disorder, unspecified: Secondary | ICD-10-CM | POA: Diagnosis not present

## 2024-09-07 DIAGNOSIS — F419 Anxiety disorder, unspecified: Secondary | ICD-10-CM | POA: Diagnosis not present

## 2024-09-13 DIAGNOSIS — F419 Anxiety disorder, unspecified: Secondary | ICD-10-CM | POA: Diagnosis not present

## 2024-09-27 DIAGNOSIS — F419 Anxiety disorder, unspecified: Secondary | ICD-10-CM | POA: Diagnosis not present

## 2024-09-28 DIAGNOSIS — F419 Anxiety disorder, unspecified: Secondary | ICD-10-CM | POA: Diagnosis not present

## 2024-10-18 DIAGNOSIS — F419 Anxiety disorder, unspecified: Secondary | ICD-10-CM | POA: Diagnosis not present

## 2024-10-25 DIAGNOSIS — F419 Anxiety disorder, unspecified: Secondary | ICD-10-CM | POA: Diagnosis not present
